# Patient Record
Sex: Male | Born: 1955 | Race: White | Hispanic: No | Marital: Married | Smoking: Never smoker
Health system: Southern US, Community
[De-identification: ages and names within clinical notes are randomized; demographics above are authoritative.]

## PROBLEM LIST (undated history)

## (undated) DIAGNOSIS — J45909 Unspecified asthma, uncomplicated: Secondary | ICD-10-CM

## (undated) DIAGNOSIS — Z85828 Personal history of other malignant neoplasm of skin: Secondary | ICD-10-CM

## (undated) DIAGNOSIS — E785 Hyperlipidemia, unspecified: Secondary | ICD-10-CM

## (undated) DIAGNOSIS — A7749 Other ehrlichiosis: Secondary | ICD-10-CM

## (undated) HISTORY — DX: Hyperlipidemia, unspecified: E78.5

## (undated) HISTORY — PX: BASAL CELL CARCINOMA EXCISION: SHX1214

## (undated) HISTORY — DX: Other ehrlichiosis: A77.49

---

## 2010-11-01 HISTORY — PX: COLONOSCOPY: SHX174

## 2010-12-03 ENCOUNTER — Ambulatory Visit: Payer: Self-pay | Admitting: Internal Medicine

## 2015-05-20 ENCOUNTER — Ambulatory Visit: Payer: Self-pay | Admitting: Internal Medicine

## 2015-05-21 ENCOUNTER — Ambulatory Visit (INDEPENDENT_AMBULATORY_CARE_PROVIDER_SITE_OTHER): Payer: BLUE CROSS/BLUE SHIELD | Admitting: Internal Medicine

## 2015-05-21 ENCOUNTER — Encounter: Payer: Self-pay | Admitting: Internal Medicine

## 2015-05-21 VITALS — BP 144/84 | HR 72 | Ht 72.0 in | Wt 225.0 lb

## 2015-05-21 DIAGNOSIS — D485 Neoplasm of uncertain behavior of skin: Secondary | ICD-10-CM | POA: Diagnosis not present

## 2015-05-21 DIAGNOSIS — L309 Dermatitis, unspecified: Secondary | ICD-10-CM

## 2015-05-21 DIAGNOSIS — D126 Benign neoplasm of colon, unspecified: Secondary | ICD-10-CM | POA: Insufficient documentation

## 2015-05-21 DIAGNOSIS — J45909 Unspecified asthma, uncomplicated: Secondary | ICD-10-CM | POA: Insufficient documentation

## 2015-05-21 DIAGNOSIS — R609 Edema, unspecified: Secondary | ICD-10-CM | POA: Insufficient documentation

## 2015-05-21 DIAGNOSIS — E7849 Other hyperlipidemia: Secondary | ICD-10-CM | POA: Insufficient documentation

## 2015-05-21 MED ORDER — CLOBETASOL PROPIONATE 0.05 % EX LOTN: 1.0000 "application " | TOPICAL_LOTION | Freq: Two times a day (BID) | CUTANEOUS | Status: DC

## 2015-05-21 MED ORDER — PREDNISONE 10 MG (21) PO TBPK: 10.0000 mg | ORAL_TABLET | Freq: Every day | ORAL | Status: DC

## 2015-05-21 MED ORDER — SULFAMETHOXAZOLE-TRIMETHOPRIM 800-160 MG PO TABS: 1.0000 | ORAL_TABLET | Freq: Two times a day (BID) | ORAL | Status: DC

## 2015-05-21 NOTE — Progress Notes (Signed)
Date:  05/21/2015   Name:  John Mathews   DOB:  April 08, 1956   MRN:  [id number]   Chief Complaint: Rash Rash This is a recurrent problem. The current episode started 1 to 4 weeks ago. The problem has been rapidly worsening since onset. The affected locations include the abdomen, right axilla, left axilla, chest and torso. The rash is characterized by blistering, burning, itchiness, draining, scaling and redness. Pertinent negatives include no cough, fatigue or fever. Past treatments include antihistamine and topical steroids.  He has had this rash before - seen by Dr. Nehemiah Mathews in the past.  He also has multiple skin lesions that he thinks are likely skin cancer.  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  He is not aware of any other triggers.  He has been using clobetasol with some benefit but the rash is most persistent this time.   Review of Systems:  Review of Systems  Constitutional: Negative for fever, chills and fatigue.  Respiratory: Negative for cough and wheezing.   Cardiovascular: Negative for chest pain.  Musculoskeletal: Negative for myalgias and arthralgias.  Skin: Positive for rash.    Patient Active Problem List   Diagnosis Date Noted  . 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 AB (asthmatic bronchitis) 05/21/2015  . Accumulation of fluid in tissues 05/21/2015  . History of colon polyps 05/21/2015    Prior to Admission medications   Medication Sig Start Date End Date Taking? Authorizing Provider  budesonide-formoterol (SYMBICORT) 160-4.5 MCG/ACT inhaler Inhale 1 Inhaler into the lungs 2 (two) times daily. 02/10/15  Yes Historical Provider, MD  Clobetasol Propionate (CLOBEX) 0.05 % lotion Clobex, 0.05% (External Lotion) - Historical Medication  (0.05 %) Active   Yes Historical Provider, MD    No Known Allergies  No past surgical history on file.  History  Substance Use Topics  . Smoking  status: Never Smoker   . Smokeless tobacco: Not on file  . Alcohol Use: 0.0 oz/week    0 Standard drinks or equivalent per week     Medication list has been reviewed and updated.  Physical Examination:  Physical Exam  Constitutional: He appears well-developed and well-nourished.  Cardiovascular: Normal rate and regular rhythm.   Pulmonary/Chest: Effort normal and breath sounds normal. He has no wheezes.  Skin: Skin is warm and dry. Rash noted. Rash is papular and pustular. There is erythema.  Multiple papular lesions over chest and axilla - some with erythema and crusting.  No bleeding.  Scaly lesions over both arms and forehead - suspicious for malignancy.    BP 164/80 mmHg  Pulse 72  Ht 6' (1.829 m)  Wt 225 lb (102.059 kg)  BMI 30.51 kg/m2  Assessment and Plan: 1. Dermatitis of multiple sites With bacterial super-infection - sulfamethoxazole-trimethoprim (BACTRIM DS,SEPTRA DS) 800-160 MG per tablet; Take 1 tablet by mouth 2 (two) times daily.  Dispense: 20 tablet; Refill: 0 - predniSONE (STERAPRED UNI-PAK 21 TAB) 10 MG (21) TBPK tablet; Take 1 tablet (10 mg total) by mouth daily.  Dispense: 21 tablet; Refill: 0 - Clobetasol Propionate (CLOBEX) 0.05 % lotion; Apply 1 application topically 2 (two) times daily.  Dispense: 59 mL; Refill: 1 - Ambulatory referral to Dermatology  2. Neoplasm of uncertain behavior of skin Needs Dermatology evaluation   Halina Maidens, MD [facility]  05/21/2015

## 2015-05-30 ENCOUNTER — Ambulatory Visit (INDEPENDENT_AMBULATORY_CARE_PROVIDER_SITE_OTHER): Payer: BLUE CROSS/BLUE SHIELD | Admitting: Internal Medicine

## 2015-05-30 ENCOUNTER — Encounter: Payer: Self-pay | Admitting: Internal Medicine

## 2015-05-30 VITALS — BP 104/64 | HR 80 | Temp 98.2°F | Ht 72.0 in | Wt 211.8 lb

## 2015-05-30 DIAGNOSIS — J0100 Acute maxillary sinusitis, unspecified: Secondary | ICD-10-CM

## 2015-05-30 MED ORDER — AMOXICILLIN 875 MG PO TABS: 875.0000 mg | ORAL_TABLET | Freq: Two times a day (BID) | ORAL | Status: DC

## 2015-05-30 MED ORDER — FLUTICASONE PROPIONATE 50 MCG/ACT NA SUSP: 2.0000 | Freq: Every day | NASAL | Status: DC

## 2015-05-30 NOTE — Progress Notes (Signed)
Date:  05/30/2015   Name:  John Mathews   DOB:  1956-08-08   MRN:  [id number]   Chief Complaint: Cough; Rash; Sinusitis; and Fever Rash This is a chronic problem. The current episode started 1 to 4 weeks ago. The problem is unchanged. The affected locations include the left axilla and right axilla (dermatology thinks the rash is sensitivity). Associated symptoms include a fever and a sore throat. Pertinent negatives include no cough or shortness of breath. Treatments tried: Dermatology removed a lesion on his hand and froze several lesions on his back.  Sinusitis This is a new problem. The current episode started in the past 7 days. The problem is unchanged. Maximum temperature: felt feverish but did not measure. Associated symptoms include chills, headaches, sinus pressure and a sore throat. Pertinent negatives include no coughing, diaphoresis, ear pain or shortness of breath. Past treatments include nothing.  Fever  This is a new problem. The current episode started yesterday. His temperature was unmeasured prior to arrival. Associated symptoms include headaches, a rash and a sore throat. Pertinent negatives include no abdominal pain, chest pain, coughing, ear pain or wheezing. He has tried NSAIDs for the symptoms. The treatment provided moderate relief.     Review of Systems:  Review of Systems  Constitutional: Positive for fever and chills. Negative for diaphoresis.  HENT: Positive for sinus pressure and sore throat. Negative for ear discharge and ear pain.   Respiratory: Negative for cough, chest tightness, shortness of breath and wheezing.   Cardiovascular: Negative for chest pain and palpitations.  Gastrointestinal: Negative for abdominal pain.  Skin: Positive for rash.  Neurological: Positive for dizziness and headaches. Negative for syncope.    Patient Active Problem List   Diagnosis Date Noted  . 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 AB  (asthmatic bronchitis) 05/21/2015  . Accumulation of fluid in tissues 05/21/2015  . History of colon polyps 05/21/2015  . Dermatitis of multiple sites 05/21/2015    Prior to Admission medications   Medication Sig Start Date End Date Taking? Authorizing Provider  budesonide-formoterol (SYMBICORT) 160-4.5 MCG/ACT inhaler Inhale 1 Inhaler into the lungs 2 (two) times daily. 02/10/15  Yes Historical Provider, MD  Clobetasol Propionate (CLOBEX) 0.05 % lotion Apply 1 application topically 2 (two) times daily. 05/21/15  Yes Glean Hess, MD    No Known Allergies  No past surgical history on file.  History  Substance Use Topics  . Smoking status: Never Smoker   . Smokeless tobacco: Not on file  . Alcohol Use: 0.0 oz/week    0 Standard drinks or equivalent per week     Medication list has been reviewed and updated.  Physical Examination:  Physical Exam  Constitutional: He is oriented to person, place, and time. He appears well-developed. No distress.  HENT:  Head: Normocephalic and atraumatic.  Right Ear: Ear canal normal. Tympanic membrane is erythematous and retracted.  Left Ear: Ear canal normal. Tympanic membrane is erythematous and retracted.  Nose: Right sinus exhibits maxillary sinus tenderness. Right sinus exhibits no frontal sinus tenderness. Left sinus exhibits maxillary sinus tenderness. Left sinus exhibits no frontal sinus tenderness.  Mouth/Throat: Uvula is midline. Oropharyngeal exudate and posterior oropharyngeal erythema present. No posterior oropharyngeal edema.  Eyes: Conjunctivae are normal. Right eye exhibits no discharge. Left eye exhibits no discharge. No scleral icterus.  Neck: Normal range of motion. Neck supple.  Cardiovascular: Normal rate, regular rhythm and normal heart sounds.   Pulmonary/Chest: Effort normal and breath sounds normal.  No respiratory distress.  Musculoskeletal: Normal range of motion.  Lymphadenopathy:    He has no cervical adenopathy.   Neurological: He is alert and oriented to person, place, and time.  Skin: Skin is warm and dry. No rash noted.  Skin: Skin is warm and dry. Rash noted. Rash is papular and pustular. There is erythema.  Multiple papular lesions over chest and axilla - some with erythema and crusting. No bleeding. Rash under axilla less inflammed than last visit   Psychiatric: He has a normal mood and affect. His behavior is normal. Thought content normal.    BP 104/64 mmHg  Pulse 80  Temp(Src) 98.2 F (36.8 C)  Ht 6' (1.829 m)  Wt 211 lb 12.8 oz (96.072 kg)  BMI 28.72 kg/m2  SpO2 98%  Assessment and Plan: 1. Acute maxillary sinusitis, recurrence not specified - fluticasone (FLONASE) 50 MCG/ACT nasal spray; Place 2 sprays into both nostrils daily.  Dispense: 16 g; Refill: 6 - amoxicillin (AMOXIL) 875 MG tablet; Take 1 tablet (875 mg total) by mouth 2 (two) times daily.  Dispense: 20 tablet; Refill: 0   Halina Maidens, MD [facility]  05/30/2015

## 2015-06-03 ENCOUNTER — Inpatient Hospital Stay: Payer: BLUE CROSS/BLUE SHIELD

## 2015-06-03 ENCOUNTER — Emergency Department: Payer: BLUE CROSS/BLUE SHIELD

## 2015-06-03 ENCOUNTER — Ambulatory Visit (INDEPENDENT_AMBULATORY_CARE_PROVIDER_SITE_OTHER)
Admission: EM | Admit: 2015-06-03 | Discharge: 2015-06-03 | Disposition: A | Discharge status: Home / Self Care | Payer: BLUE CROSS/BLUE SHIELD | Source: Home / Self Care | Attending: Emergency Medicine | Admitting: Emergency Medicine

## 2015-06-03 ENCOUNTER — Inpatient Hospital Stay
Admission: EM | Admit: 2015-06-03 | Discharge: 2015-06-06 | DRG: 872 | Disposition: A | Discharge status: Home / Self Care | Payer: BLUE CROSS/BLUE SHIELD | Attending: Internal Medicine | Admitting: Internal Medicine

## 2015-06-03 DIAGNOSIS — E860 Dehydration: Secondary | ICD-10-CM | POA: Diagnosis not present

## 2015-06-03 DIAGNOSIS — D696 Thrombocytopenia, unspecified: Secondary | ICD-10-CM | POA: Diagnosis present

## 2015-06-03 DIAGNOSIS — R21 Rash and other nonspecific skin eruption: Secondary | ICD-10-CM | POA: Diagnosis present

## 2015-06-03 DIAGNOSIS — R509 Fever, unspecified: Secondary | ICD-10-CM | POA: Insufficient documentation

## 2015-06-03 DIAGNOSIS — A94 Unspecified arthropod-borne viral fever: Secondary | ICD-10-CM | POA: Diagnosis present

## 2015-06-03 DIAGNOSIS — Z79899 Other long term (current) drug therapy: Secondary | ICD-10-CM

## 2015-06-03 DIAGNOSIS — Z85828 Personal history of other malignant neoplasm of skin: Secondary | ICD-10-CM

## 2015-06-03 DIAGNOSIS — A419 Sepsis, unspecified organism: Secondary | ICD-10-CM

## 2015-06-03 DIAGNOSIS — I959 Hypotension, unspecified: Secondary | ICD-10-CM | POA: Diagnosis present

## 2015-06-03 DIAGNOSIS — E871 Hypo-osmolality and hyponatremia: Secondary | ICD-10-CM | POA: Diagnosis not present

## 2015-06-03 DIAGNOSIS — J45909 Unspecified asthma, uncomplicated: Secondary | ICD-10-CM

## 2015-06-03 DIAGNOSIS — I340 Nonrheumatic mitral (valve) insufficiency: Secondary | ICD-10-CM | POA: Diagnosis not present

## 2015-06-03 HISTORY — DX: Unspecified asthma, uncomplicated: J45.909

## 2015-06-03 HISTORY — DX: Personal history of other malignant neoplasm of skin: Z85.828

## 2015-06-03 LAB — COMPREHENSIVE METABOLIC PANEL
ALT: 161 U/L — ABNORMAL HIGH (ref 17–63)
ALT: 146 U/L — ABNORMAL HIGH (ref 17–63)
AST: 105 U/L — ABNORMAL HIGH (ref 15–41)
AST: 93 U/L — ABNORMAL HIGH (ref 15–41)
Albumin: 2.9 g/dL — ABNORMAL LOW (ref 3.5–5.0)
Albumin: 2.5 g/dL — ABNORMAL LOW (ref 3.5–5.0)
Alkaline Phosphatase: 106 U/L (ref 38–126)
Alkaline Phosphatase: 88 U/L (ref 38–126)
Anion gap: 10 (ref 5–15)
Anion gap: 10 (ref 5–15)
BILIRUBIN TOTAL: 1.9 mg/dL — AB (ref 0.3–1.2)
BUN: 20 mg/dL (ref 6–20)
BUN: 18 mg/dL (ref 6–20)
CALCIUM: 7.6 mg/dL — AB (ref 8.9–10.3)
CO2: 25 mmol/L (ref 22–32)
CO2: 24 mmol/L (ref 22–32)
Calcium: 8.1 mg/dL — ABNORMAL LOW (ref 8.9–10.3)
Chloride: 89 mmol/L — ABNORMAL LOW (ref 101–111)
Chloride: 94 mmol/L — ABNORMAL LOW (ref 101–111)
Creatinine, Ser: 0.9 mg/dL (ref 0.61–1.24)
Creatinine, Ser: 0.94 mg/dL (ref 0.61–1.24)
GFR calc Af Amer: >60 mL/min (ref >60)
GFR calc Af Amer: >60 mL/min (ref >60)
GFR calc non Af Amer: >60 mL/min (ref >60)
GFR calc non Af Amer: >60 mL/min (ref >60)
Glucose, Bld: 135 mg/dL — ABNORMAL HIGH (ref 65–99)
Glucose, Bld: 116 mg/dL — ABNORMAL HIGH (ref 65–99)
POTASSIUM: 3.5 mmol/L (ref 3.5–5.1)
Potassium: 3.6 mmol/L (ref 3.5–5.1)
Sodium: 124 mmol/L — ABNORMAL LOW (ref 135–145)
Sodium: 128 mmol/L — ABNORMAL LOW (ref 135–145)
Total Bilirubin: 2.2 mg/dL — ABNORMAL HIGH (ref 0.3–1.2)
Total Protein: 6.1 g/dL — ABNORMAL LOW (ref 6.5–8.1)
Total Protein: 5.5 g/dL — ABNORMAL LOW (ref 6.5–8.1)

## 2015-06-03 LAB — URINALYSIS COMPLETE WITH MICROSCOPIC (ARMC ONLY)
Glucose, UA: NEGATIVE mg/dL
Leukocytes, UA: NEGATIVE
Nitrite: NEGATIVE
Protein, ur: 100 mg/dL — AB
Specific Gravity, Urine: 1.02 (ref 1.005–1.030)
pH: 5.5 (ref 5.0–8.0)

## 2015-06-03 LAB — CBC WITH DIFFERENTIAL/PLATELET
Basophils Absolute: 0 10*3/uL (ref 0–0.1)
Basophils Absolute: 0 10*3/uL (ref 0–0.1)
Basophils Relative: 1 %
Basophils Relative: 0 %
EOS ABS: 0 10*3/uL (ref 0–0.7)
Eosinophils Absolute: 0 10*3/uL (ref 0–0.7)
Eosinophils Relative: 0 %
Eosinophils Relative: 0 %
HCT: 37.2 % — ABNORMAL LOW (ref 40.0–52.0)
HCT: 36 % — ABNORMAL LOW (ref 40.0–52.0)
Hemoglobin: 13.2 g/dL (ref 13.0–18.0)
Hemoglobin: 12.5 g/dL — ABNORMAL LOW (ref 13.0–18.0)
LYMPHS ABS: 0.4 10*3/uL — AB (ref 1.0–3.6)
Lymphocytes Relative: 4 %
Lymphs Abs: 0.2 10*3/uL — ABNORMAL LOW (ref 1.0–3.6)
MCH: 33.2 pg (ref 26.0–34.0)
MCH: 33.1 pg (ref 26.0–34.0)
MCHC: 35.6 g/dL (ref 32.0–36.0)
MCHC: 34.9 g/dL (ref 32.0–36.0)
MCV: 93.4 fL (ref 80.0–100.0)
MCV: 94.9 fL (ref 80.0–100.0)
Monocytes Absolute: 0.3 10*3/uL (ref 0.2–1.0)
Monocytes Absolute: 0.2 10*3/uL (ref 0.2–1.0)
Monocytes Relative: 6 %
Monocytes Relative: 4 %
Neutro Abs: 4.4 10*3/uL (ref 1.4–6.5)
Neutro Abs: 4 10*3/uL (ref 1.4–6.5)
Neutrophils Relative %: 89 %
PLATELETS: 50 10*3/uL — AB (ref 150–440)
Platelets: 47 10*3/uL — ABNORMAL LOW (ref 150–440)
RBC: 3.98 MIL/uL — ABNORMAL LOW (ref 4.40–5.90)
RBC: 3.79 MIL/uL — AB (ref 4.40–5.90)
RDW: 13.4 % (ref 11.5–14.5)
RDW: 13.3 % (ref 11.5–14.5)
WBC: 5 10*3/uL (ref 3.8–10.6)
WBC: 4.6 10*3/uL (ref 3.8–10.6)

## 2015-06-03 LAB — RAPID HIV SCREEN (HIV 1/2 AB+AG)
HIV 1/2 Antibodies: NONREACTIVE
HIV-1 P24 ANTIGEN - HIV24: NONREACTIVE

## 2015-06-03 LAB — LACTIC ACID, PLASMA
Lactic Acid, Venous: 1.9 mmol/L (ref 0.5–2.0)
Lactic Acid, Venous: 1.5 mmol/L (ref 0.5–2.0)

## 2015-06-03 LAB — PROTIME-INR
INR: 1.13
Prothrombin Time: 14.7 seconds (ref 11.4–15.0)

## 2015-06-03 LAB — APTT: aPTT: 35 seconds (ref 24–36)

## 2015-06-03 LAB — PROCALCITONIN: PROCALCITONIN: 1.97 ng/mL

## 2015-06-03 LAB — SEDIMENTATION RATE: Sed Rate: 32 mm/hr — ABNORMAL HIGH (ref 0–16)

## 2015-06-03 MED ORDER — VANCOMYCIN HCL IN DEXTROSE 1-5 GM/200ML-% IV SOLN
1000.0000 mg | Freq: Once | INTRAVENOUS | Status: AC
  Administered 2015-06-03: 1000 mg via INTRAVENOUS
  Filled 2015-06-03: qty 200

## 2015-06-03 MED ORDER — IBUPROFEN 800 MG PO TABS
800.0000 mg | ORAL_TABLET | Freq: Once | ORAL | Status: AC
  Administered 2015-06-03: 800 mg via ORAL

## 2015-06-03 MED ORDER — SODIUM CHLORIDE 0.9 % IV BOLUS (SEPSIS): 1062.0000 mL | Freq: Once | INTRAVENOUS | Status: AC

## 2015-06-03 MED ORDER — PIPERACILLIN-TAZOBACTAM 3.375 G IVPB: 3.3750 g | Freq: Once | INTRAVENOUS | Status: DC

## 2015-06-03 MED ORDER — NOREPINEPHRINE BITARTRATE 1 MG/ML IV SOLN: 5.0000 ug/min | INTRAVENOUS | Status: DC

## 2015-06-03 MED ORDER — SODIUM CHLORIDE 0.9 % IV SOLN: INTRAVENOUS | Status: DC

## 2015-06-03 MED ORDER — SODIUM CHLORIDE 0.9 % IV BOLUS (SEPSIS): 500.0000 mL | INTRAVENOUS | Status: AC

## 2015-06-03 MED ORDER — ALUM & MAG HYDROXIDE-SIMETH 200-200-20 MG/5ML PO SUSP: 30.0000 mL | Freq: Four times a day (QID) | ORAL | Status: DC | PRN

## 2015-06-03 MED ORDER — DOCUSATE SODIUM 100 MG PO CAPS
100.0000 mg | ORAL_CAPSULE | Freq: Two times a day (BID) | ORAL | Status: DC
  Administered 2015-06-03 – 2015-06-05 (×4): 100 mg via ORAL
  Filled 2015-06-03 (×5): qty 1

## 2015-06-03 MED ORDER — PIPERACILLIN-TAZOBACTAM 3.375 G IVPB 30 MIN: 3.3750 g | Freq: Once | INTRAVENOUS | Status: DC

## 2015-06-03 MED ORDER — VANCOMYCIN HCL IN DEXTROSE 1-5 GM/200ML-% IV SOLN
1000.0000 mg | Freq: Three times a day (TID) | INTRAVENOUS | Status: DC
  Administered 2015-06-04 – 2015-06-05 (×4): 1000 mg via INTRAVENOUS
  Filled 2015-06-03 (×5): qty 200

## 2015-06-03 MED ORDER — BUDESONIDE-FORMOTEROL FUMARATE 160-4.5 MCG/ACT IN AERO
2.0000 | INHALATION_SPRAY | Freq: Two times a day (BID) | RESPIRATORY_TRACT | Status: DC
  Administered 2015-06-04 – 2015-06-06 (×5): 2 via RESPIRATORY_TRACT
  Filled 2015-06-03: qty 6

## 2015-06-03 MED ORDER — PIPERACILLIN-TAZOBACTAM 3.375 G IVPB 30 MIN
3.3750 g | Freq: Once | INTRAVENOUS | Status: AC
  Administered 2015-06-03: 3.375 g via INTRAVENOUS
  Filled 2015-06-03: qty 50

## 2015-06-03 MED ORDER — SODIUM CHLORIDE 0.9 % IJ SOLN
3.0000 mL | Freq: Two times a day (BID) | INTRAMUSCULAR | Status: DC
  Administered 2015-06-03 – 2015-06-05 (×4): 3 mL via INTRAVENOUS

## 2015-06-03 MED ORDER — ACETAMINOPHEN 650 MG RE SUPP: 650.0000 mg | Freq: Four times a day (QID) | RECTAL | Status: DC | PRN

## 2015-06-03 MED ORDER — SODIUM CHLORIDE 0.9 % IV BOLUS (SEPSIS)
1000.0000 mL | Freq: Once | INTRAVENOUS | Status: AC
  Administered 2015-06-03: 1000 mL via INTRAVENOUS

## 2015-06-03 MED ORDER — ACETAMINOPHEN 325 MG PO TABS
650.0000 mg | ORAL_TABLET | Freq: Four times a day (QID) | ORAL | Status: DC | PRN
  Administered 2015-06-03: 650 mg via ORAL
  Filled 2015-06-03: qty 2

## 2015-06-03 MED ORDER — ONDANSETRON HCL 4 MG/2ML IJ SOLN
4.0000 mg | Freq: Four times a day (QID) | INTRAMUSCULAR | Status: DC | PRN
  Administered 2015-06-05: 4 mg via INTRAVENOUS
  Filled 2015-06-03: qty 2

## 2015-06-03 MED ORDER — SODIUM CHLORIDE 0.9 % IV BOLUS (SEPSIS): 1000.0000 mL | INTRAVENOUS | Status: AC

## 2015-06-03 MED ORDER — BISACODYL 5 MG PO TBEC: 5.0000 mg | DELAYED_RELEASE_TABLET | Freq: Every day | ORAL | Status: DC | PRN

## 2015-06-03 MED ORDER — ONDANSETRON HCL 4 MG PO TABS
4.0000 mg | ORAL_TABLET | Freq: Four times a day (QID) | ORAL | Status: DC | PRN
Start: 2015-06-03 — End: 2015-06-06

## 2015-06-03 MED ORDER — DOXYCYCLINE HYCLATE 100 MG PO TABS
100.0000 mg | ORAL_TABLET | Freq: Two times a day (BID) | ORAL | Status: DC
Start: 2015-06-03 — End: 2015-06-06
  Administered 2015-06-03 – 2015-06-06 (×6): 100 mg via ORAL
  Filled 2015-06-03 (×6): qty 1

## 2015-06-03 MED ORDER — HYDROCODONE-ACETAMINOPHEN 5-325 MG PO TABS
1.0000 | ORAL_TABLET | ORAL | Status: DC | PRN
  Administered 2015-06-04 (×2): 2 via ORAL
  Administered 2015-06-04: 1 via ORAL
  Administered 2015-06-04: 2 via ORAL
  Filled 2015-06-03: qty 1
  Filled 2015-06-03 (×3): qty 2

## 2015-06-03 MED ORDER — SODIUM CHLORIDE 0.9 % IV SOLN
INTRAVENOUS | Status: DC
  Administered 2015-06-03 – 2015-06-05 (×3): via INTRAVENOUS

## 2015-06-03 MED ORDER — VANCOMYCIN HCL IN DEXTROSE 1-5 GM/200ML-% IV SOLN: 1000.0000 mg | Freq: Once | INTRAVENOUS | Status: DC

## 2015-06-03 MED ORDER — FLUTICASONE PROPIONATE 50 MCG/ACT NA SUSP
2.0000 | Freq: Every day | NASAL | Status: DC
  Administered 2015-06-04 – 2015-06-06 (×3): 2 via NASAL
  Filled 2015-06-03: qty 16

## 2015-06-03 MED ORDER — CLOBETASOL PROPIONATE 0.05 % EX CREA
1.0000 "application " | TOPICAL_CREAM | Freq: Two times a day (BID) | CUTANEOUS | Status: DC
  Administered 2015-06-04: 1 via TOPICAL
  Filled 2015-06-03: qty 15

## 2015-06-03 MED ORDER — PIPERACILLIN-TAZOBACTAM 3.375 G IVPB
3.3750 g | Freq: Three times a day (TID) | INTRAVENOUS | Status: DC
  Administered 2015-06-04 – 2015-06-05 (×5): 3.375 g via INTRAVENOUS
  Filled 2015-06-03 (×6): qty 50

## 2015-06-03 NOTE — ED Provider Notes (Signed)
[facility] Emergency Department Provider Note  ____________________________________________  Time seen: Approximately 5:33 PM  I have reviewed the triage vital signs and the nursing notes.   HISTORY  Chief Complaint Fever; Nausea; and Emesis    HPI John Mathews is a 59 y.o. male with history of asthma who presents for evaluation of approximately one week gradual onset fever, chills, constant since onset. He is also had 5 days constant headache, gradual onset, not associated with any neck pain or stiffness, photophobia or photophobia. He has had one week of rash as well. He was seen by dermatologist with a biopsy however he has not received results. 5 days ago he was started on amoxicillin for possible sinusitis however he reports his symptoms have not improved. He was seen in urgent care earlier today and was sent to the emergency department for fever of unknown origin as well as hyponatremia. Current severity of symptoms is moderate to severe. No modifying factors. He does work outdoors and is unsure whether or not he has had a tick exposure however he has not removed a tick from his body recently.   Past Medical History  Diagnosis Date  . Asthma   . History of basal cell cancer     Patient Active Problem List   Diagnosis Date Noted  . 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 AB (asthmatic bronchitis) 05/21/2015  . Accumulation of fluid in tissues 05/21/2015  . History of colon polyps 05/21/2015  . Dermatitis of multiple sites 05/21/2015    Past Surgical History  Procedure Laterality Date  . Basal cell carcinoma excision      Current Outpatient Rx  Name  Route  Sig  Dispense  Refill  . amoxicillin (AMOXIL) 875 MG tablet   Oral   Take 1 tablet (875 mg total) by mouth 2 (two) times daily.   20 tablet   0   . budesonide-formoterol (SYMBICORT) 160-4.5 MCG/ACT inhaler   Inhalation   Inhale 1 Inhaler into the lungs 2  (two) times daily.         . Clobetasol Propionate (CLOBEX) 0.05 % lotion   Topical   Apply 1 application topically 2 (two) times daily.   59 mL   1   . fluticasone (FLONASE) 50 MCG/ACT nasal spray   Each Nare   Place 2 sprays into both nostrils daily.   16 g   6     Allergies Review of patient's allergies indicates no known allergies.  Family History  Problem Relation Age of Onset  . Schizophrenia Mother   . Dementia Father     Social History History  Substance Use Topics  . Smoking status: Never Smoker   . Smokeless tobacco: Never Used  . Alcohol Use: 0.0 oz/week    0 Standard drinks or equivalent per week     Comment: occasional    Review of Systems Constitutional: + fever/chills Eyes: No visual changes. ENT: No sore throat. Cardiovascular: Denies chest pain. Respiratory: Denies shortness of breath. Gastrointestinal: No abdominal pain.  + nausea, no vomiting.  No diarrhea.  No constipation. Genitourinary: Negative for dysuria. Musculoskeletal: Negative for back pain. Skin: Positive for rash. Neurological: Posiive for headache, no focal weakness or numbness.  10-point ROS otherwise negative.  ____________________________________________   PHYSICAL EXAM:  VITAL SIGNS: ED Triage Vitals  Enc Vitals Group     BP 06/03/15 1409 107/55 mmHg     Pulse Rate 06/03/15 1409 107     Resp 06/03/15  1409 18     Temp 06/03/15 1409 99.6 F (37.6 C)     Temp Source 06/03/15 1409 Oral     SpO2 06/03/15 1409 99 %     Weight 06/03/15 1409 225 lb (102.059 kg)     Height 06/03/15 1409 6' (1.829 m)     Head Cir --      Peak Flow --      Pain Score 06/03/15 1410 7     Pain Loc --      Pain Edu? --      Excl. in [place]? --     Constitutional: Alert and oriented. Nontoxic appearing and in no acute distress. Eyes: Conjunctivae are normal. PERRL. EOMI. Head: Atraumatic. Nose: No congestion/rhinnorhea. Mouth/Throat: Mucous membranes are moist.  Oropharynx  non-erythematous. Neck: No stridor.   Cardiovascular: tachycardic rate, regular rhythm. Grossly normal heart sounds.  Good peripheral circulation. Respiratory: Normal respiratory effort.  No retractions. Lungs CTAB. Gastrointestinal: Soft and nontender. No distention. No abdominal bruits. No CVA tenderness. Genitourinary: deferred Musculoskeletal: No lower extremity tenderness nor edema.  No joint effusions. Healing biopsy site to the chest and right hand. Neurologic:  Normal speech and language. No gross focal neurologic deficits are appreciated. No gait instability. Skin:  Skin is warm, dry and intact. Blanching petechial rash throughout the chest. Psychiatric: Mood and affect are normal. Speech and behavior are normal.  ____________________________________________   LABS (all labs ordered are listed, but only abnormal results are displayed)  Labs Reviewed  CULTURE, BLOOD (ROUTINE X 2)  CULTURE, BLOOD (ROUTINE X 2)  LACTIC ACID, PLASMA  LACTIC ACID, PLASMA   ____________________________________________  EKG  none ____________________________________________  RADIOLOGY  CXR IMPRESSION: Findings suggest bronchitis, acute versus chronic.  Streaky bibasilar atelectasis but no focal airspace consolidation or pleural effusion.  ____________________________________________   PROCEDURES  Procedure(s) performed: None  Critical Care performed: Yes, see critical care note(s). Total critical care time spent 30 minutes.  ____________________________________________   INITIAL IMPRESSION / ASSESSMENT AND PLAN / ED COURSE  Pertinent labs & imaging results that were available during my care of the patient were reviewed by me and considered in my medical decision making (see chart for details).  John Mathews is a 59 y.o. male with history of asthma who presents for evaluation of approximately one week gradual onset fever, chills, constant since onset. On exam, he is  nontoxic appearing but he does appear mildly fatigued. He is meeting 2 out of 4 Sirs criteria for fever, tachycardia. Neck is supple without meningismus, doubt meningitis and given 1 weeks of symptoms and nontoxic appearance, I feel meningitis is very unlikely. He does have a blanching petechial rash throughout the torso. Clinical picture most consistent with sepsis possibly secondary to tick borne illness. Labs from urgent care reviewed and are notable for hyponatremia with a sodium of 124. Labs were also notable for mild ESR elevation at 34. No leukocytosis, no anemia. Normal creatinine. Urinalysis not consistent with infection. Plan for chest x-ray to rule out pneumonia. We'll give IV fluids, vancomycin and Zosyn. Anticipate admission. Of note, titers for RMSF, [place]rellia sent at urgent care.  ----------------------------------------- 6:25 PM on 06/03/2015 -----------------------------------------  Tachycardia improving. Chest x-ray shows no evidence of pneumonia. Case discussed with hospitalist for admission. ____________________________________________   FINAL CLINICAL IMPRESSION(S) / ED DIAGNOSES  Final diagnoses:  Sepsis, due to unspecified organism  Hyponatremia      Joanne Gavel, MD 06/03/15 [phone]

## 2015-06-03 NOTE — ED Notes (Signed)
Patient states that fevers started Wednesday. Patient states that he had been on Bactrim antibiotics when the fever started. He states that Friday he went back to Dr. Carolin Coy and she took him off of bactrim and placed him on amoxicillin which he is currently taking. He states that Dr. Carolin Coy thought that he had a sinus infection and ear ache. He states that the headaches and fever have not broke. He states that he has been having dry heaves and is unable to eat. States that he has been having chills and night sweats. States that he is works outside and hasn't been bit by ticks recently.

## 2015-06-03 NOTE — H&P (Signed)
John Mathews at [facility] NAME: John Mathews    MR#:  [id number]  DATE OF BIRTH:  08/12/1956  DATE OF ADMISSION:  06/03/2015  PRIMARY CARE PHYSICIAN: John Maidens, MD   REQUESTING/REFERRING PHYSICIAN: Darrick Mathews, M.D.  CHIEF COMPLAINT:   Chief Complaint  Patient presents with  . Fever  . Nausea  . Emesis   HISTORY OF PRESENT ILLNESS:  John Mathews  is a 59 y.o. male with a known history of asthma, basal cell carcinoma diagnosed in 2007 is being admitted for sepsis.  He reports gradual onset fever for last 6 days , associated with chills, night sweats, constant since onset. He is also had 5 days constant headache, gradual onset, not associated with any neck pain or stiffness, photophobia or phonophobia. He has had one week of rash as well. He was seen by dermatologist who performed biopsy last Tuesday however he has not received results. 5 days ago he was started on amoxicillin for possible sinusitis however he reports his symptoms have not improved. He was seen in [place] urgent care earlier today and was sent to the emergency department for fever of unknown origin as well as hyponatremia. He does work outdoors and is unsure whether or not he has had a tick exposure however he has not removed a tick from his body recently and states that he uses permethrin for last 3 months which makes it very unlikely to have Tick exposure.  He also reports having some blood in the urine while at urgent care. PAST MEDICAL HISTORY:   Past Medical History  Diagnosis Date  . Asthma   . History of basal cell cancer    PAST SURGICAL HISTORY:   Past Surgical History  Procedure Laterality Date  . Basal cell carcinoma excision     SOCIAL HISTORY:   History  Substance Use Topics  . Smoking status: Never Smoker   . Smokeless tobacco: Never Used  . Alcohol Use: 0.0 oz/week    0 Standard drinks or equivalent per week     Comment: occasional   FAMILY  HISTORY:   Family History  Problem Relation Age of Onset  . Schizophrenia Mother   . Dementia Father    DRUG ALLERGIES:  No Known Allergies REVIEW OF SYSTEMS:   Review of Systems  Constitutional: Positive for fever, chills and malaise/fatigue. Negative for weight loss and diaphoresis.  HENT: Negative for ear discharge, ear pain, hearing loss, nosebleeds, sore throat and tinnitus.   Eyes: Negative for blurred vision and pain.  Respiratory: Negative for cough, hemoptysis, shortness of breath and wheezing.   Cardiovascular: Negative for chest pain, palpitations, orthopnea and leg swelling.  Gastrointestinal: Positive for nausea. Negative for heartburn, vomiting, abdominal pain, diarrhea, constipation and blood in stool.  Genitourinary: Positive for hematuria. Negative for dysuria, urgency and frequency.  Musculoskeletal: Positive for myalgias and joint pain. Negative for back pain.  Skin: Positive for rash. Negative for itching.  Neurological: Positive for weakness and headaches. Negative for dizziness, tingling, tremors, focal weakness and seizures.  Psychiatric/Behavioral: Negative for depression. The patient is not nervous/anxious.    MEDICATIONS AT HOME:   Prior to Admission medications   Medication Sig Start Date End Date Taking? Authorizing Provider  amoxicillin (AMOXIL) 875 MG tablet Take 1 tablet (875 mg total) by mouth 2 (two) times daily. 05/30/15  Yes Glean Hess, MD  budesonide-formoterol [facility]) 160-4.5 MCG/ACT inhaler Inhale 1 puff into the lungs 2 (two) times daily.  Yes Historical Provider, MD  Clobetasol Propionate (CLOBEX) 0.05 % lotion Apply 1 application topically 2 (two) times daily. 05/21/15  Yes Glean Hess, MD  fluticasone (FLONASE) 50 MCG/ACT nasal spray Place 2 sprays into both nostrils daily. 05/30/15  Yes Glean Hess, MD   VITAL SIGNS:  Blood pressure 120/75, pulse 99, temperature 99.6 F (37.6 C), temperature source Oral, resp. rate 23,  height 6' (1.829 m), weight 102.059 kg (225 lb), SpO2 97 %. PHYSICAL EXAMINATION:  Physical Exam  Constitutional: He is oriented to person, place, and time and well-developed, well-nourished, and in no distress.  HENT:  Head: Normocephalic and atraumatic.  Eyes: Conjunctivae and EOM are normal. Pupils are equal, round, and reactive to light.  Neck: Normal range of motion. Neck supple. No tracheal deviation present. No thyromegaly present.  Cardiovascular: Normal rate, regular rhythm and normal heart sounds.   Pulmonary/Chest: Effort normal and breath sounds normal. No respiratory distress. He has no wheezes. He exhibits no tenderness.  Abdominal: Soft. Bowel sounds are normal. He exhibits no distension. There is no tenderness.  Musculoskeletal: Normal range of motion.  Neurological: He is alert and oriented to person, place, and time. No cranial nerve deficit.  Skin: Skin is warm and dry. Petechiae and rash noted. Rash is maculopapular.     Psychiatric: Mood and affect normal.   LABORATORY PANEL:   CBC  Recent Labs Lab 06/03/15 1232  WBC 5.0  HGB 13.2  HCT 37.2*  PLT 47*   ------------------------------------------------------------------------------------------------------------------  Chemistries   Recent Labs Lab 06/03/15 1232  NA 124*  K 3.6  CL 89*  CO2 25  GLUCOSE 135*  BUN 20  CREATININE 0.90  CALCIUM 8.1*  AST 105*  ALT 161*  ALKPHOS 106  BILITOT 2.2*   RADIOLOGY:  Dg Chest 2 View  06/03/2015   CLINICAL DATA:  Fever and headaches for 5 days.  EXAM: CHEST  2 VIEW  COMPARISON:  None.  FINDINGS: The cardiac silhouette, mediastinal and hilar contours are within normal limits. Prominent interstitial markings could be due to smoking or acute or chronic bronchitis. No focal airspace consolidation to suggest pneumonia. Streaky bibasilar atelectasis. No pleural effusion. The bony thorax is intact.  IMPRESSION: Findings suggest bronchitis, acute versus chronic.   Streaky bibasilar atelectasis but no focal airspace consolidation or pleural effusion.   Electronically Signed   By: Marijo Sanes M.D.   On: 06/03/2015 18:17   IMPRESSION AND PLAN:   * Sepsis: With fever, hypotension, tachycardia.  No other source, likely tickborne illness - possible [facility] spotted fever, broad-spectrum IV antibiotics, oral doxycycline, blood cultures  * likely tickborne illness - possible [facility] spotted fever.  Fever, petechial rash, severe hyponatremia - thrombocytopenia, elevated LFTs, headache and myalgia.  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 We will treat him with oral doxycycline, consult infectious disease, serology titers ordered for RMSF and Ehrlichia  * Severe hyponatremia: Likely due to likely tickborne illness - possible [facility] spotted fever.  Aggressive IV hydration and monitor  * Severe thrombocytopenia: Petechial rash, avoid antiplatelets. Monitor  * Elevated LFTs: likely tickborne illness - possible [facility] spotted fever, monitor with IV hydration.   All the records are reviewed and case discussed with ED provider. Management plans discussed with the patient and he is in agreement.  CODE STATUS: Full code  TOTAL TIME TAKING CARE OF THIS PATIENT: 55 minutes.    [facility], Kimbella Heisler M.D on 06/03/2015 at 7:35 PM  Between 7am to 6pm - Pager - [phone]  After 6pm go to [email or web address] - password [email or web address] [place]  Office  [phone]  CC: Primary care physician; John Maidens, MD

## 2015-06-03 NOTE — ED Notes (Signed)
Provide pt urinal, no other needs identified at this time

## 2015-06-03 NOTE — ED Notes (Signed)
2 wks ago went to md for rash had biopsy of some of the ras, then 7 days ago developed fever, headache, went to md few days later  Placed on antibiotics for possible sinus infection, , few days later develiped more fever, sweats

## 2015-06-03 NOTE — ED Provider Notes (Signed)
CSN: [id number]     Arrival date & time 06/03/15  1101 History   First MD Initiated Contact with Patient 06/03/15 1158     Chief Complaint  Patient presents with  . Fever   (Consider location/radiation/quality/duration/timing/severity/associated sxs/prior Treatment) HPI  This a 59 year old gentleman who presents with a fever 102.1 chills, rigors, and headache for approximately 1 week. He states he's had a rash for over a month and the fevers for a week. He's been treated by Dr. Army Melia and given initially Bactrim and then switched to amoxicillin. In addition he's been seen a dermatologist who has biopsied the rash but the results have not been called to him yet. Last night he's had for 5 night sweats and chills and from his description sounds as if they were rigors. He has been very dry and complains of frequent urination with very orange appearing urine. His headaches are very generalized persistent. He complains of no visual disturbances but his eyes are very bloodshot. He has no photophobia or neck stiffness. Dr. Army Melia has been treating him for a sinus infection possible ear infection. He has not been eating because he has a decreased appetite. 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 He states he is not aware of any tick bites recently.  Past Medical History  Diagnosis Date  . Asthma   . History of basal cell cancer    Past Surgical History  Procedure Laterality Date  . Basal cell carcinoma excision     Family History  Problem Relation Age of Onset  . Schizophrenia Mother   . Dementia Father    History  Substance Use Topics  . Smoking status: Never Smoker   . Smokeless tobacco: Not on file  . Alcohol Use: 0.0 oz/week    0 Standard drinks or equivalent per week     Comment: occasional    Review of Systems  Constitutional: Positive for fever, chills, appetite change and fatigue.  HENT: Positive for congestion.   Gastrointestinal: Positive for nausea  and vomiting.  Musculoskeletal: Positive for myalgias and arthralgias.  Neurological: Positive for headaches.  All other systems reviewed and are negative.   Allergies  Review of patient's allergies indicates no known allergies.  Home Medications   Prior to Admission medications   Medication Sig Start Date End Date Taking? Authorizing Provider  amoxicillin (AMOXIL) 875 MG tablet Take 1 tablet (875 mg total) by mouth 2 (two) times daily. 05/30/15  Yes Glean Hess, MD  budesonide-formoterol [facility]) 160-4.5 MCG/ACT inhaler Inhale 1 Inhaler into the lungs 2 (two) times daily. 02/10/15  Yes Historical Provider, MD  Clobetasol Propionate (CLOBEX) 0.05 % lotion Apply 1 application topically 2 (two) times daily. 05/21/15  Yes Glean Hess, MD  fluticasone (FLONASE) 50 MCG/ACT nasal spray Place 2 sprays into both nostrils daily. 05/30/15  Yes Glean Hess, MD   BP 134/71 mmHg  Pulse 108  Temp(Src) 102.1 F (38.9 C) (Tympanic)  Resp 18  Ht 6' (1.829 m)  Wt 225 lb (102.059 kg)  BMI 30.51 kg/m2  SpO2 97% Physical Exam  Constitutional: He is oriented to person, place, and time. He appears well-developed and well-nourished. He appears distressed.  HENT:  Head: Normocephalic and atraumatic.  Right Ear: External ear normal.  Left Ear: External ear normal.  Mucous membranes are dry  Eyes: Pupils are equal, round, and reactive to light. Right eye exhibits no discharge. Left eye exhibits no discharge. No scleral icterus.  Neck: Neck supple.  Cardiovascular: Normal rate and regular rhythm.  Exam reveals no gallop and no friction rub.   Murmur heard. Pulmonary/Chest: Breath sounds normal. No stridor. No respiratory distress. He has no wheezes. He has no rales.  Abdominal: Soft. Bowel sounds are normal. He exhibits no distension. There is no tenderness. There is no rebound and no guarding.  Musculoskeletal: Normal range of motion.  Lymphadenopathy:    He has no cervical adenopathy.   Neurological: He is alert and oriented to person, place, and time.  Skin: Skin is warm and dry. Rash noted.  Psychiatric: He has a normal mood and affect. His behavior is normal. Judgment and thought content normal.  Nursing note and vitals reviewed.   ED Course  Procedures (including critical care time) Labs Review Labs Reviewed  CBC WITH DIFFERENTIAL/PLATELET - Abnormal; Notable for the following:    RBC 3.98 (*)    HCT 37.2 (*)    Platelets 47 (*)    Lymphs Abs 0.2 (*)    All other components within normal limits  SEDIMENTATION RATE - Abnormal; Notable for the following:    Sed Rate 32 (*)    All other components within normal limits  COMPREHENSIVE METABOLIC PANEL - Abnormal; Notable for the following:    Sodium 124 (*)    Chloride 89 (*)    Glucose, Bld 135 (*)    Calcium 8.1 (*)    Total Protein 6.1 (*)    Albumin 2.9 (*)    AST 105 (*)    ALT 161 (*)    Total Bilirubin 2.2 (*)    All other components within normal limits  URINALYSIS COMPLETEWITH MICROSCOPIC ([facility] ONLY) - Abnormal; Notable for the following:    Color, Urine AMBER (*)    Bilirubin Urine 2+ (*)    Ketones, ur 1+ (*)    Hgb urine dipstick TRACE (*)    Protein, ur 100 (*)    Squamous Epithelial / LPF 0-5 (*)    All other components within normal limits  URINE CULTURE  ROCKY MTN SPOTTED FVR ABS PNL(IGG+IGM)  B. BURGDORFI ANTIBODIES    Imaging Review No results found.   MDM   1. Acute hyponatremia   2. Dehydration   3. Fever of undetermined origin    Patient was seen along with Dr. Vanita Panda. He doesn't is dehydration and hyponatremia fevers of unknown origin and his rash with possibility of tick bite fever is recommended that they be seen at the emergency room at [facility] for further evaluation and workup. They're agreeable to this. He was stable at time of his departure. His wife was transporting him by privately owned vehicle.    Lorin Picket, PA-C 06/03/15 [id number]

## 2015-06-03 NOTE — ED Notes (Signed)
Pt sent from urgent care , states he has had HA and fever for over a week and has been on 2 different kinds of abx, night sweats, Na+ 124 and had other testing at urgent care.Marland Kitchen

## 2015-06-03 NOTE — Progress Notes (Signed)
ANTIBIOTIC CONSULT NOTE - INITIAL  Pharmacy Consult for vancomycin/Zosyn Indication: rule out sepsis  No Known Allergies  Patient Measurements: Height: 6' (182.9 cm) Weight: 219 lb 9.6 oz (99.61 kg) IBW/kg (Calculated) : 77.6   Vital Signs: Temp: 98.5 F (36.9 C) (08/02 2032) Temp Source: Oral (08/02 2032) BP: 106/62 mmHg (08/02 2032) Pulse Rate: 92 (08/02 2032) Intake/Output from previous day:   Intake/Output from this shift:    Labs:  Recent Labs  06/03/15 1232  WBC 5.0  HGB 13.2  PLT 47*  CREATININE 0.90   Estimated Creatinine Clearance: 110.7 mL/min (by C-G formula based on Cr of 0.9). No results for input(s): VANCOTROUGH, VANCOPEAK, VANCORANDOM, GENTTROUGH, GENTPEAK, GENTRANDOM, TOBRATROUGH, TOBRAPEAK, TOBRARND, AMIKACINPEAK, AMIKACINTROU, AMIKACIN in the last 72 hours.   Microbiology: No results found for this or any previous visit (from the past 720 hour(s)).  Medical History: Past Medical History  Diagnosis Date  . Asthma   . History of basal cell cancer      Assessment: 59 yo male starting on vanc/Zosyn for sepsis Pt received Zosyn 3.375 g IV x1 at 1827 and vancomycin 1 g IV x1 at 1828  Goal of Therapy:  Vancomycin trough level 15-20 mcg/ml  Plan:  Will order vancomycin 1 g IV q8h Vanc trough before 5th dose 8/4 at 0130 Will need to continue to monitor renal function and culture results  Will order Zosyn 3.375 g IV q8h EI  Rocky Morel 06/03/2015,9:43 PM

## 2015-06-04 ENCOUNTER — Inpatient Hospital Stay (HOSPITAL_COMMUNITY)
Admit: 2015-06-04 | Discharge: 2015-06-04 | Disposition: A | Discharge status: Home / Self Care | Payer: BLUE CROSS/BLUE SHIELD | Attending: Infectious Diseases | Admitting: Infectious Diseases

## 2015-06-04 DIAGNOSIS — I340 Nonrheumatic mitral (valve) insufficiency: Secondary | ICD-10-CM

## 2015-06-04 LAB — CBC
HCT: 37 % — ABNORMAL LOW (ref 40.0–52.0)
Hemoglobin: 12.8 g/dL — ABNORMAL LOW (ref 13.0–18.0)
MCH: 33 pg (ref 26.0–34.0)
MCHC: 34.6 g/dL (ref 32.0–36.0)
MCV: 95.2 fL (ref 80.0–100.0)
PLATELETS: 56 10*3/uL — AB (ref 150–440)
RBC: 3.89 MIL/uL — AB (ref 4.40–5.90)
RDW: 13.7 % (ref 11.5–14.5)
WBC: 4.7 10*3/uL (ref 3.8–10.6)

## 2015-06-04 LAB — BASIC METABOLIC PANEL
Anion gap: 10 (ref 5–15)
BUN: 20 mg/dL (ref 6–20)
CO2: 25 mmol/L (ref 22–32)
Calcium: 8.1 mg/dL — ABNORMAL LOW (ref 8.9–10.3)
Chloride: 95 mmol/L — ABNORMAL LOW (ref 101–111)
Creatinine, Ser: 0.96 mg/dL (ref 0.61–1.24)
GFR calc non Af Amer: >60 mL/min (ref >60)
GLUCOSE: 123 mg/dL — AB (ref 65–99)
Potassium: 3.5 mmol/L (ref 3.5–5.1)
SODIUM: 130 mmol/L — AB (ref 135–145)

## 2015-06-04 LAB — LACTIC ACID, PLASMA: Lactic Acid, Venous: 1.6 mmol/L (ref 0.5–2.0)

## 2015-06-04 LAB — PROTIME-INR
INR: 1.1
Prothrombin Time: 14.4 seconds (ref 11.4–15.0)

## 2015-06-04 LAB — GLUCOSE, CAPILLARY: GLUCOSE-CAPILLARY: 131 mg/dL — AB (ref 65–99)

## 2015-06-04 NOTE — Progress Notes (Signed)
Alert and oriented. Sinus rhythm on tele. BP has been borderline low, stable and afebrile. Patient states he feels "crappy" due to not sleeping for almost five days now, also complaining of an ongoing headache that is not relieved well with medication, will continue to assess. Patient still receiving IV abx and fluids. Cultures have come back negative so far. Will continue to monitor.

## 2015-06-04 NOTE — Consult Note (Signed)
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   He was seen by his PCP and started on bactrim about a week ago and was referred to derm.  He had 2 bxps done and the results are pending.  While on the bactrim he developed the fevers. He stopped the bactrim as thought may be causing some of the symptoms. Reveied amoxicillin  5 days ago for possible sinus infection but sxs persisted. He has a great deal of outdoor exposure, even wading chest deep in a swamp in June.  He has 2 dogs but no other known animal exposure. He does not recall a tick bite and actually tries very hard to avoid using permethrin.   He has not recent travel and no sick contacts.  Since admission he actually is already feeling a little better and feels fever may have broken.  Past Medical History  Diagnosis Date  . Asthma   . History of basal cell cancer    Past Surgical History  Procedure Laterality Date  . Basal cell carcinoma excision     History  Substance Use Topics  . Smoking status: Never Smoker   . Smokeless tobacco: Never Used  . Alcohol Use: 0.0 oz/week    0 Standard drinks or equivalent per week     Comment: occasional   Family History  Problem Relation Age of Onset  . Schizophrenia Mother   . Dementia Father     Allergies: No Known Allergies  Current antibiotics: Antibiotics Given (last 72 hours)    Date/Time Action Medication Dose Rate   06/03/15 2214 Given   doxycycline (VIBRA-TABS) tablet 100 mg 100 mg    06/04/15 0014 Given  [computer would not load to scan med]   piperacillin-tazobactam (ZOSYN) IVPB  3.375 g 3.375 g 12.5 mL/hr   06/04/15 0223 Given   vancomycin (VANCOCIN) IVPB 1000 mg/200 mL premix 1,000 mg 200 mL/hr   06/04/15 0535 Given   piperacillin-tazobactam (ZOSYN) IVPB 3.375 g 3.375 g 12.5 mL/hr   06/04/15 0906 Given   doxycycline (VIBRA-TABS) tablet 100 mg 100 mg    06/04/15 0908 Given   vancomycin (VANCOCIN) IVPB 1000 mg/200 mL premix 1,000 mg 200 mL/hr   06/04/15 1428 Given   piperacillin-tazobactam (ZOSYN) IVPB 3.375 g 3.375 g 12.5 mL/hr   06/04/15 1710 Given   vancomycin (VANCOCIN) IVPB 1000 mg/200 mL premix 1,000 mg 200 mL/hr      MEDICATIONS: . budesonide-formoterol  2 puff Inhalation BID  . clobetasol cream  1 application Topical BID  . docusate sodium  100 mg Oral BID  . doxycycline  100 mg Oral Q12H  . fluticasone  2 spray Each Nare Daily  . piperacillin-tazobactam (ZOSYN)  IV  3.375 g Intravenous 3 times per day  . sodium chloride  3 mL Intravenous Q12H  . vancomycin  1,000 mg Intravenous Q8H    Review of Systems - 11 systems reviewed and negative per HPI   OBJECTIVE: Temp:  [98 F (36.7 C)-98.5 F (36.9 C)] 98.4 F (36.9 C) (08/03 1122) Pulse Rate:  [73-92] 73 (08/03 1829) Resp:  [  18-20] 18 (08/03 1122) BP: (97-124)/(58-71) 119/66 mmHg (08/03 1829) SpO2:  [96 %-97 %] 97 % (08/03 1122) Weight:  [99.383 kg (219 lb 1.6 oz)-99.61 kg (219 lb 9.6 oz)] 99.383 kg (219 lb 1.6 oz) (08/03 0502) Physical Exam  Constitutional: He is oriented to person, place, and time. moderaterly ill apperaing, diaphoretic  HENT: PERRLA, EOMI, anicteric, no conjunctiviti Neck supple Mouth/Throat: Oropharynx is clear and dry. No oropharyngeal exudate.  Cardiovascular: Normal rate, regular rhythm and normal heart sounds. 2.6 sm Pulmonary/Chest: Effort normal and breath sounds normal. No respiratory distress. He has no wheezes.  Abdominal: Soft. Bowel sounds are normal. He exhibits no distension. There is no tenderness.  Lymphadenopathy:  He has no cervical adenopathy.   Neurological: He is alert and oriented to person, place, and time.  Skin: Skin is warm and dry. Diffuse pink eruption over chest and abd.  Palms nml Psychiatric: He has a normal mood and affect. His behavior is normal.    LABS: Results for orders placed or performed during the hospital encounter of 06/03/15 (from the past 48 hour(s))  Blood culture (routine x 2)     Status: None (Preliminary result)   Collection Time: 06/03/15  4:57 PM  Result Value Ref Range   Specimen Description BLOOD LEFT LEFT ANTECUBITAL    Special Requests BOTTLES DRAWN AEROBIC AND ANAEROBIC 6CC    Culture NO GROWTH < 24 HOURS    Report Status PENDING   Lactic acid, plasma     Status: None   Collection Time: 06/03/15  4:57 PM  Result Value Ref Range   Lactic Acid, Venous 1.9 0.5 - 2.0 mmol/L  Blood culture (routine x 2)     Status: None (Preliminary result)   Collection Time: 06/03/15  5:06 PM  Result Value Ref Range   Specimen Description BLOOD LEFT HAND    Special Requests BAA,5CCAER,5CCANA    Culture NO GROWTH < 24 HOURS    Report Status PENDING   Lactic acid, plasma     Status: None   Collection Time: 06/03/15  9:35 PM  Result Value Ref Range   Lactic Acid, Venous 1.5 0.5 - 2.0 mmol/L  Rapid HIV screen (HIV 1/2 Ab+Ag)     Status: None   Collection Time: 06/03/15  9:35 PM  Result Value Ref Range   HIV-1 P24 Antigen - HIV24 NON REACTIVE NON REACTIVE   HIV 1/2 Antibodies NON REACTIVE NON REACTIVE   Interpretation (HIV Ag Ab)      A non reactive test result means that HIV 1 or HIV 2 antibodies and HIV 1 p24 antigen were not detected in the specimen.  CBC with Differential     Status: Abnormal   Collection Time: 06/03/15  9:35 PM  Result Value Ref Range   WBC 4.6 3.8 - 10.6 K/uL   RBC 3.79 (L) 4.40 - 5.90 MIL/uL   Hemoglobin 12.5 (L) 13.0 - 18.0 g/dL   HCT 36.0 (L) 40.0 - 52.0 %   MCV 94.9 80.0 - 100.0 fL   MCH 33.1 26.0 - 34.0 pg   MCHC 34.9 32.0 - 36.0 g/dL   RDW 13.3 11.5 - 14.5 %   Platelets  50 (L) 150 - 440 K/uL   Neutrophils Relative % 87% %   Neutro Abs 4.0 1.4 - 6.5 K/uL   Lymphocytes Relative 9% %   Lymphs Abs 0.4 (L) 1.0 - 3.6 K/uL   Monocytes Relative 4% %   Monocytes Absolute 0.2 0.2 - 1.0 K/uL  Eosinophils Relative 0% %   Eosinophils Absolute 0.0 0 - 0.7 K/uL   Basophils Relative 0% %   Basophils Absolute 0.0 0 - 0.1 K/uL  Comprehensive metabolic panel     Status: Abnormal   Collection Time: 06/03/15  9:35 PM  Result Value Ref Range   Sodium 128 (L) 135 - 145 mmol/L   Potassium 3.5 3.5 - 5.1 mmol/L   Chloride 94 (L) 101 - 111 mmol/L   CO2 24 22 - 32 mmol/L   Glucose, Bld 116 (H) 65 - 99 mg/dL   BUN 18 6 - 20 mg/dL   Creatinine, Ser 1.46 0.61 - 1.24 mg/dL   Calcium 7.6 (L) 8.9 - 10.3 mg/dL   Total Protein 5.5 (L) 6.5 - 8.1 g/dL   Albumin 2.5 (L) 3.5 - 5.0 g/dL   AST 93 (H) 15 - 41 U/L   ALT 146 (H) 17 - 63 U/L   Alkaline Phosphatase 88 38 - 126 U/L   Total Bilirubin 1.9 (H) 0.3 - 1.2 mg/dL   GFR calc non Af Amer >60 >60 mL/min   GFR calc Af Amer >60 >60 mL/min    Comment: (NOTE) The eGFR has been calculated using the CKD EPI equation. This calculation has not been validated in all clinical situations. eGFR's persistently <60 mL/min signify possible Chronic Kidney Disease.    Anion gap 10 5 - 15  Procalcitonin     Status: None   Collection Time: 06/03/15  9:35 PM  Result Value Ref Range   Procalcitonin 1.97 ng/mL    Comment:        Interpretation: PCT > 0.5 ng/mL and <= 2 ng/mL: Systemic infection (sepsis) is possible, but other conditions are known to elevate PCT as well. (NOTE)         ICU PCT Algorithm               Non ICU PCT Algorithm    ----------------------------     ------------------------------         PCT < 0.25 ng/mL                 PCT < 0.1 ng/mL     Stopping of antibiotics            Stopping of antibiotics       strongly encouraged.               strongly encouraged.    ----------------------------      ------------------------------       PCT level decrease by               PCT < 0.25 ng/mL       >= 80% from peak PCT       OR PCT 0.25 - 0.5 ng/mL          Stopping of antibiotics                                             encouraged.     Stopping of antibiotics           encouraged.    ----------------------------     ------------------------------       PCT level decrease by              PCT >= 0.25 ng/mL       < 80% from peak PCT  AND PCT >= 0.5 ng/mL             Continuing antibiotics                                              encouraged.       Continuing antibiotics            encouraged.    ----------------------------     ------------------------------     PCT level increase compared          PCT > 0.5 ng/mL         with peak PCT AND          PCT >= 0.5 ng/mL             Escalation of antibiotics                                          strongly encouraged.      Escalation of antibiotics        strongly encouraged.   Protime-INR     Status: None   Collection Time: 06/03/15  9:35 PM  Result Value Ref Range   Prothrombin Time 14.7 11.4 - 15.0 seconds   INR 1.13   APTT     Status: None   Collection Time: 06/03/15  9:35 PM  Result Value Ref Range   aPTT 35 24 - 36 seconds  Lactic acid, plasma     Status: None   Collection Time: 06/03/15 11:55 PM  Result Value Ref Range   Lactic Acid, Venous 1.6 0.5 - 2.0 mmol/L  Basic metabolic panel     Status: Abnormal   Collection Time: 06/04/15  4:13 AM  Result Value Ref Range   Sodium 130 (L) 135 - 145 mmol/L   Potassium 3.5 3.5 - 5.1 mmol/L   Chloride 95 (L) 101 - 111 mmol/L   CO2 25 22 - 32 mmol/L   Glucose, Bld 123 (H) 65 - 99 mg/dL   BUN 20 6 - 20 mg/dL   Creatinine, Ser 0.96 0.61 - 1.24 mg/dL   Calcium 8.1 (L) 8.9 - 10.3 mg/dL   GFR calc non Af Amer >60 >60 mL/min   GFR calc Af Amer >60 >60 mL/min    Comment: (NOTE) The eGFR has been calculated using the CKD EPI equation. This calculation has not been validated  in all clinical situations. eGFR's persistently <60 mL/min signify possible Chronic Kidney Disease.    Anion gap 10 5 - 15  CBC     Status: Abnormal   Collection Time: 06/04/15  4:13 AM  Result Value Ref Range   WBC 4.7 3.8 - 10.6 K/uL   RBC 3.89 (L) 4.40 - 5.90 MIL/uL   Hemoglobin 12.8 (L) 13.0 - 18.0 g/dL   HCT 37.0 (L) 40.0 - 52.0 %   MCV 95.2 80.0 - 100.0 fL   MCH 33.0 26.0 - 34.0 pg   MCHC 34.6 32.0 - 36.0 g/dL   RDW 13.7 11.5 - 14.5 %   Platelets 56 (L) 150 - 440 K/uL  Protime-INR     Status: None   Collection Time: 06/04/15  4:13 AM  Result Value Ref Range   Prothrombin Time 14.4 11.4 - 15.0 seconds   INR 1.10  Glucose, capillary     Status: Abnormal   Collection Time: 06/04/15  9:02 AM  Result Value Ref Range   Glucose-Capillary 131 (H) 65 - 99 mg/dL   No components found for: ESR, C REACTIVE PROTEIN MICRO: Recent Results (from the past 720 hour(s))  Urine culture     Status: None (Preliminary result)   Collection Time: 06/03/15  1:00 PM  Result Value Ref Range Status   Specimen Description URINE, CLEAN CATCH  Final   Special Requests amoxicillin previously bactrim.  Final   Culture NO GROWTH < 24 HOURS  Final   Report Status PENDING  Incomplete  Blood culture (routine x 2)     Status: None (Preliminary result)   Collection Time: 06/03/15  4:57 PM  Result Value Ref Range Status   Specimen Description BLOOD LEFT LEFT ANTECUBITAL  Final   Special Requests BOTTLES DRAWN AEROBIC AND ANAEROBIC 6CC  Final   Culture NO GROWTH < 24 HOURS  Final   Report Status PENDING  Incomplete  Blood culture (routine x 2)     Status: None (Preliminary result)   Collection Time: 06/03/15  5:06 PM  Result Value Ref Range Status   Specimen Description BLOOD LEFT HAND  Final   Special Requests BAA,5CCAER,5CCANA  Final   Culture NO GROWTH < 24 HOURS  Final   Report Status PENDING  Incomplete    IMAGING: Dg Chest 2 View  06/03/2015   CLINICAL DATA:  Fever and headaches for 5 days.   EXAM: CHEST  2 VIEW  COMPARISON:  None.  FINDINGS: The cardiac silhouette, mediastinal and hilar contours are within normal limits. Prominent interstitial markings could be due to smoking or acute or chronic bronchitis. No focal airspace consolidation to suggest pneumonia. Streaky bibasilar atelectasis. No pleural effusion. The bony thorax is intact.  IMPRESSION: Findings suggest bronchitis, acute versus chronic.  Streaky bibasilar atelectasis but no focal airspace consolidation or pleural effusion.   Electronically Signed   By: Marijo Sanes M.D.   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   He was seen by his PCP and started on bactrim about a week ago and was referred to derm.  He had 2 bxps done and the results are pending.  While on the bactrim he developed the fevers. He stopped the bactrim as thought may be causing some of the symptoms. Reveied amoxicillin  5 days ago for possible sinus infection but sxs persisted. He has a great deal of outdoor exposure, even wading chest deep in a swamp in [place].  He has 2 dogs but no other known animal exposure. He does not recall a tick bite and actually tries very hard to avoid using permethrin.   He has not recent travel and no sick contacts.  Since admission he actually is already feeling a little better and feels fever may have broken. Labs reveal  thrombocytopenia, lymphopenia, hyponatremia and elevated ast alt.HIV negative. [place] negativeHe does have a murmur on exam.  I suspect tick borne illness with erhlichosis or RMSF. Rash is likely unrelated. Diff includes viral infections, leptospirosis, hepatitis, heme malignancy, endocarditis. Recommendations Await bcx Cont current abx Check echo Check tick serologies Follow other  labs  Thank you very much for  allowing me to participate in the care of this patient. Please call with questions.   Cheral Marker. Ola Spurr, MD

## 2015-06-04 NOTE — Progress Notes (Signed)
Pt's skin checked on admission with Clois Dupes.

## 2015-06-04 NOTE — Progress Notes (Signed)
[place] [facility] [place] NAME: John Mathews    MR#:  [id number]  DATE OF BIRTH:  1956/07/30  SUBJECTIVE:  CHIEF COMPLAINT:   Chief Complaint  Patient presents with  . Fever  . Nausea  . Emesis   Patient here due to fever, nausea, vomiting. Noted to have thrombocytopenia, abnormal LFTs and likely a tickborne illness. Feels a bit better today. No fever overnight.  REVIEW OF SYSTEMS:    Review of Systems  Constitutional: Negative for fever, chills and weight loss.  HENT: Negative for congestion, nosebleeds and tinnitus.   Eyes: Negative for blurred vision and double vision.  Respiratory: Negative for cough, hemoptysis, shortness of breath and wheezing.   Cardiovascular: Negative for chest pain, orthopnea, leg swelling and PND.  Gastrointestinal: Negative for nausea, vomiting, abdominal pain and diarrhea.  Genitourinary: Negative for dysuria and hematuria.  Skin: Positive for rash.  Neurological: Negative for dizziness, sensory change, focal weakness, weakness and headaches.  All other systems reviewed and are negative.   Nutrition: Heart healthy Tolerating Diet: Yes Tolerating PT: Ambulatory   DRUG ALLERGIES:  No Known Allergies  VITALS:  Blood pressure 124/71, pulse 83, temperature 98.4 F (36.9 C), temperature source Oral, resp. rate 18, height 6' (1.829 m), weight 99.383 kg (219 lb 1.6 oz), SpO2 97 %.  PHYSICAL EXAMINATION:   Physical Exam  GENERAL:  59 y.o.-year-old patient lying in the bed with no acute distress.  EYES: Pupils equal, round, reactive to light and accommodation. No scleral icterus. Extraocular muscles intact.  HEENT: Head atraumatic, normocephalic. Oropharynx and nasopharynx clear.  NECK:  Supple, no jugular venous distention. No thyroid enlargement, no tenderness.  LUNGS: Normal breath sounds bilaterally, no wheezing, rales, rhonchi. No use of accessory muscles of respiration.  CARDIOVASCULAR: S1,  S2 normal. 2/6 systolic ejection murmur at the right sternal border, no rubs, or gallops.  ABDOMEN: Soft, nontender, nondistended. Bowel sounds present. No organomegaly or mass.  EXTREMITIES: No cyanosis, clubbing or edema b/l.    NEUROLOGIC: Cranial nerves II through XII are intact. No focal Motor or sensory deficits b/l.   PSYCHIATRIC: The patient is alert and oriented x 3. Good affect SKIN: Macular rash on the trunk and neck area. Positive right hand ulcer due to skin cancer.  LABORATORY PANEL:   CBC  Recent Labs Lab 06/04/15 0413  WBC 4.7  HGB 12.8*  HCT 37.0*  PLT 56*   ------------------------------------------------------------------------------------------------------------------  Chemistries   Recent Labs Lab 06/03/15 2135 06/04/15 0413  NA 128* 130*  K 3.5 3.5  CL 94* 95*  CO2 24 25  GLUCOSE 116* 123*  BUN 18 20  CREATININE 0.94 0.96  CALCIUM 7.6* 8.1*  AST 93*  --   ALT 146*  --   ALKPHOS 88  --   BILITOT 1.9*  --    ------------------------------------------------------------------------------------------------------------------  Cardiac Enzymes No results for input(s): TROPONINI in the last 168 hours. ------------------------------------------------------------------------------------------------------------------  RADIOLOGY:  Dg Chest 2 View  06/03/2015   CLINICAL DATA:  Fever and headaches for 5 days.  EXAM: CHEST  2 VIEW  COMPARISON:  None.  FINDINGS: The cardiac silhouette, mediastinal and hilar contours are within normal limits. Prominent interstitial markings could be due to smoking or acute or chronic bronchitis. No focal airspace consolidation to suggest pneumonia. Streaky bibasilar atelectasis. No pleural effusion. The bony thorax is intact.  IMPRESSION: Findings suggest bronchitis, acute versus chronic.  Streaky bibasilar atelectasis but no focal airspace consolidation or pleural effusion.  Electronically Signed   By: Marijo Sanes M.D.   On:  06/03/2015 18:17     ASSESSMENT AND PLAN:   59 year old male with past medical history of asthma, history of basal cancer, who presented to the hospital with fevers, chills, night sweats, headache. Patient was also noted to be thrombocytopenic with abnormal LFTs.   #1 sepsis-patient presented with fever tachycardia. -The likely source of this is underlying take more illness probably [facility] spotted fever given his rash. -Patient empirically has been started on vancomycin, Zosyn, Doxocycline. Vanc/Zosyn can likely be discontinued tomorrow if [facility] remain (-) or OK with infectious disease.  -Continue doxycycline, blood cultures, urine cultures so far negative. -Await tickborne illness titers.  Await infectious disease input.  #2 tickborne illness-likely cause of patient's fever petechial rash, abnormal LFTs, thrombocytopenia. -Continue IV doxycycline. Follow titers for [facility] spotted fever. -Await infectious disease input.  #3 abnormal LFTs-likely secondary to the tickborne illness. -Improving with supportive care and will follow.  #4 thrombocytopenia-also likely secondary to underlying tickborne illness. No evidence of acute bleeding. Avoid heparin products. Follow platelet count.  #5 hyponatremia-likely due to poor by mouth intake and dehydration -Continue to Hydrate with IV fluids and sodium is improving    All the records are reviewed and case discussed with Care Management/Social Workerr. Management plans discussed with the patient, family and they are in agreement.  CODE STATUS: Full  DVT Prophylaxis: Ambulatory  TOTAL TIME TAKING CARE OF THIS PATIENT: 30 minutes.   POSSIBLE D/C IN 1-2 DAYS, DEPENDING ON CLINICAL CONDITION.   Henreitta Leber M.D on 06/04/2015 at 12:28 PM  Between 7am to 6pm - Pager - [phone]  After 6pm go to [email or web address] - password [email or web address] [facility]  Office  [phone]  CC: Primary care physician; Halina Maidens,  MD

## 2015-06-05 LAB — CBC
HEMATOCRIT: 34.7 % — AB (ref 40.0–52.0)
HEMOGLOBIN: 12 g/dL — AB (ref 13.0–18.0)
MCH: 32.9 pg (ref 26.0–34.0)
MCHC: 34.4 g/dL (ref 32.0–36.0)
MCV: 95.7 fL (ref 80.0–100.0)
PLATELETS: 74 10*3/uL — AB (ref 150–440)
RBC: 3.63 MIL/uL — ABNORMAL LOW (ref 4.40–5.90)
RDW: 13.8 % (ref 11.5–14.5)
WBC: 5.1 10*3/uL (ref 3.8–10.6)

## 2015-06-05 LAB — COMPREHENSIVE METABOLIC PANEL
ALT: 129 U/L — ABNORMAL HIGH (ref 17–63)
AST: 73 U/L — AB (ref 15–41)
Albumin: 2.4 g/dL — ABNORMAL LOW (ref 3.5–5.0)
Alkaline Phosphatase: 105 U/L (ref 38–126)
Anion gap: 6 (ref 5–15)
BILIRUBIN TOTAL: 0.8 mg/dL (ref 0.3–1.2)
BUN: 15 mg/dL (ref 6–20)
CO2: 28 mmol/L (ref 22–32)
Calcium: 7.9 mg/dL — ABNORMAL LOW (ref 8.9–10.3)
Chloride: 99 mmol/L — ABNORMAL LOW (ref 101–111)
Creatinine, Ser: 0.77 mg/dL (ref 0.61–1.24)
GLUCOSE: 107 mg/dL — AB (ref 65–99)
Potassium: 3.8 mmol/L (ref 3.5–5.1)
Sodium: 133 mmol/L — ABNORMAL LOW (ref 135–145)
Total Protein: 5.2 g/dL — ABNORMAL LOW (ref 6.5–8.1)

## 2015-06-05 LAB — GLUCOSE, CAPILLARY: GLUCOSE-CAPILLARY: 100 mg/dL — AB (ref 65–99)

## 2015-06-05 LAB — B. BURGDORFI ANTIBODIES: B burgdorferi Ab IgG+IgM: <0.91 {ISR} (ref 0.00–0.90)

## 2015-06-05 LAB — ROCKY MTN SPOTTED FVR ABS PNL(IGG+IGM)
RMSF IgG: POSITIVE — AB
RMSF IgM: 0.23 index (ref 0.00–0.89)

## 2015-06-05 LAB — URINE CULTURE: Culture: NO GROWTH

## 2015-06-05 LAB — VANCOMYCIN, TROUGH: VANCOMYCIN TR: 8 ug/mL — AB (ref 10–20)

## 2015-06-05 LAB — RMSF, IGG, IFA: RMSF, IGG, IFA: 1:256 {titer} — ABNORMAL HIGH

## 2015-06-05 MED ORDER — SODIUM CHLORIDE 0.9 % IV SOLN
2000.0000 mg | Freq: Three times a day (TID) | INTRAVENOUS | Status: DC
  Filled 2015-06-05: qty 2000

## 2015-06-05 MED ORDER — VANCOMYCIN HCL 10 G IV SOLR
1500.0000 mg | Freq: Three times a day (TID) | INTRAVENOUS | Status: DC
  Filled 2015-06-05: qty 1500

## 2015-06-05 MED ORDER — VANCOMYCIN HCL IN DEXTROSE 1-5 GM/200ML-% IV SOLN
1000.0000 mg | INTRAVENOUS | Status: AC
  Administered 2015-06-05: 1000 mg via INTRAVENOUS
  Filled 2015-06-05: qty 200

## 2015-06-05 NOTE — Progress Notes (Signed)
[place] at [place] NAME: John Mathews    MR#:  [id number]  DATE OF BIRTH:  Dec 03, 1955  SUBJECTIVE:  Patient was afebrile overnight. Patient has no complaints. Patient is doing fairly well this morning.  REVIEW OF SYSTEMS:    Review of Systems  Constitutional: Negative for fever, chills, weight loss and malaise/fatigue.  HENT: Negative for congestion, nosebleeds, sore throat and tinnitus.   Eyes: Negative for blurred vision and double vision.  Respiratory: Negative for cough, hemoptysis, shortness of breath and wheezing.   Cardiovascular: Negative for chest pain, palpitations, orthopnea, leg swelling and PND.  Gastrointestinal: Negative for nausea, vomiting, abdominal pain, diarrhea and blood in stool.  Genitourinary: Negative for dysuria and hematuria.  Musculoskeletal: Negative for back pain.  Skin: Positive for rash.  Neurological: Negative for dizziness, tremors, sensory change, focal weakness, weakness and headaches.  Endo/Heme/Allergies: Does not bruise/bleed easily.  All other systems reviewed and are negative.   Nutrition: Heart healthy Tolerating Diet: Yes Tolerating PT: Ambulatory   DRUG ALLERGIES:  No Known Allergies  VITALS:  Blood pressure 118/66, pulse 79, temperature 98 F (36.7 C), temperature source Oral, resp. rate 14, height 6' (1.829 m), weight 100.789 kg (222 lb 3.2 oz), SpO2 96 %.  PHYSICAL EXAMINATION:   Physical Exam  Constitutional: He is oriented to person, place, and time and well-developed, well-nourished, and in no distress. No distress.  HENT:  Head: Normocephalic.  Eyes: No scleral icterus.  Neck: Normal range of motion. Neck supple. No JVD present. No tracheal deviation present.  Cardiovascular: Normal rate and regular rhythm.  Exam reveals no gallop and no friction rub.   Murmur heard. Pulmonary/Chest: Effort normal and breath sounds normal. No respiratory distress. He has no  wheezes. He has no rales. He exhibits no tenderness.  Abdominal: Soft. Bowel sounds are normal. He exhibits no distension and no mass. There is no tenderness. There is no rebound and no guarding.  Musculoskeletal: Normal range of motion. He exhibits no edema.  Neurological: He is alert and oriented to person, place, and time.  Skin: Skin is warm. Rash noted. No erythema.  Psychiatric: Affect and judgment normal.     LABORATORY PANEL:   CBC  Recent Labs Lab 06/05/15 0150  WBC 5.1  HGB 12.0*  HCT 34.7*  PLT 74*   ------------------------------------------------------------------------------------------------------------------  Chemistries   Recent Labs Lab 06/05/15 0150  NA 133*  K 3.8  CL 99*  CO2 28  GLUCOSE 107*  BUN 15  CREATININE 0.77  CALCIUM 7.9*  AST 73*  ALT 129*  ALKPHOS 105  BILITOT 0.8   ------------------------------------------------------------------------------------------------------------------  Cardiac Enzymes No results for input(s): TROPONINI in the last 168 hours. ------------------------------------------------------------------------------------------------------------------  RADIOLOGY:  Dg Chest 2 View  06/03/2015   CLINICAL DATA:  Fever and headaches for 5 days.  EXAM: CHEST  2 VIEW  COMPARISON:  None.  FINDINGS: The cardiac silhouette, mediastinal and hilar contours are within normal limits. Prominent interstitial markings could be due to smoking or acute or chronic bronchitis. No focal airspace consolidation to suggest pneumonia. Streaky bibasilar atelectasis. No pleural effusion. The bony thorax is intact.  IMPRESSION: Findings suggest bronchitis, acute versus chronic.  Streaky bibasilar atelectasis but no focal airspace consolidation or pleural effusion.   Electronically Signed   By: Marijo Sanes M.D.   On: 06/03/2015 18:17     ASSESSMENT AND PLAN:   59 year old male with past medical history of asthma, history of basal cancer, who  presented to the hospital with fevers, chills, night sweats, headache. Patient was also noted to be thrombocytopenic with abnormal LFTs.   1 Sepsis: Patient presented with fever and tachycardia. The sources likely due to tickborne illness. Patient was seen and evaluated by infectious disease physician. RMSF and other tick borne illness titers are still pending. Patient has been afebrile overnight. Dr. Ola Spurr had recommended discontinuing vancomycin and just keeping the patient on doxycycline. Blood and urine cultures are negative thus far. 2-D echocardiogram is pending at this time. 2. Abnormal LFTs: I suspect this is due to an underlying tickborne illness. LFTs have improved.  3. Thrombus cytopenia: This is likely secondary to tickborne illness. There is no evidence of acute bleeding. Platelet count is slowly increasing.  4. Hyponatremia: This is secondary to dehydration with poor by mouth intake and possibly related to tickborne illness. Sodium level slowly improving. BMP is ordered for the a.m.   Management plans discussed with the patient and family and they are in agreement.  CODE STATUS: Full  DVT Prophylaxis: Ambulatory  TOTAL TIME TAKING CARE OF THIS PATIENT: 25 minutes.   POSSIBLE D/C TOMORROW DEPENDING ON CLINICAL CONDITION.   Yomira Flitton M.D on 06/05/2015 at 12:10 PM  Between 7am to 6pm - Pager - [phone]  After 6pm go to [email or web address] - password [email or web address] [facility]  Office  [phone]  CC: Primary care physician; Halina Maidens, MD

## 2015-06-05 NOTE — Progress Notes (Signed)
ANTIBIOTIC CONSULT NOTE - FOLLOW UP  Pharmacy Consult for Vancomyicn Indication: rule out sepsis  No Known Allergies  Patient Measurements: Height: 6' (182.9 cm) Weight: 219 lb 1.6 oz (99.383 kg) IBW/kg (Calculated) : 77.6   Vital Signs: Temp: 98.3 F (36.8 C) (08/03 2028) Temp Source: Oral (08/03 2028) BP: 109/66 mmHg (08/03 2028) Pulse Rate: 72 (08/03 2028) Intake/Output from previous day: 08/03 0701 - 08/04 0700 In: 3073 [P.O.:870; I.V.:1703; IV Piggyback:500] Out: 1850 [Urine:1850] Intake/Output from this shift: Total I/O In: 600 [P.O.:150; I.V.:400; IV Piggyback:50] Out: 450 [Urine:450]  Labs:  Recent Labs  06/03/15 2135 06/04/15 0413 06/05/15 0150  WBC 4.6 4.7 5.1  HGB 12.5* 12.8* 12.0*  PLT 50* 56* 74*  CREATININE 0.94 0.96 0.77   Estimated Creatinine Clearance: 124.4 mL/min (by C-G formula based on Cr of 0.77).  Recent Labs  06/05/15 0150  [place] 8*     Microbiology: Recent Results (from the past 720 hour(s))  Urine culture     Status: None (Preliminary result)   Collection Time: 06/03/15  1:00 PM  Result Value Ref Range Status   Specimen Description URINE, CLEAN CATCH  Final   Special Requests amoxicillin previously bactrim.  Final   Culture NO GROWTH < 24 HOURS  Final   Report Status PENDING  Incomplete  Blood culture (routine x 2)     Status: None (Preliminary result)   Collection Time: 06/03/15  4:57 PM  Result Value Ref Range Status   Specimen Description BLOOD LEFT LEFT ANTECUBITAL  Final   Special Requests BOTTLES DRAWN AEROBIC AND ANAEROBIC 6CC  Final   Culture NO GROWTH < 24 HOURS  Final   Report Status PENDING  Incomplete  Blood culture (routine x 2)     Status: None (Preliminary result)   Collection Time: 06/03/15  5:06 PM  Result Value Ref Range Status   Specimen Description BLOOD LEFT HAND  Final   Special Requests BAA,5CCAER,5CCANA  Final   Culture NO GROWTH < 24 HOURS  Final   Report Status PENDING  Incomplete     Anti-infectives    Start     Dose/Rate Route Frequency Ordered Stop   06/05/15 1030  vancomycin (VANCOCIN) 2,000 mg in sodium chloride 0.9 % 500 mL IVPB     2,000 mg 250 mL/hr over 120 Minutes Intravenous Every 8 hours 06/05/15 0228     06/05/15 0230  vancomycin (VANCOCIN) IVPB 1000 mg/200 mL premix     1,000 mg 200 mL/hr over 60 Minutes Intravenous STAT 06/05/15 0228 06/06/15 0230   06/04/15 0200  vancomycin (VANCOCIN) IVPB 1000 mg/200 mL premix     1,000 mg 200 mL/hr over 60 Minutes Intravenous Every 8 hours 06/03/15 2143     06/04/15 0000  piperacillin-tazobactam (ZOSYN) IVPB 3.375 g     3.375 g 12.5 mL/hr over 240 Minutes Intravenous 3 times per day 06/03/15 2143     06/03/15 2200  doxycycline (VIBRA-TABS) tablet 100 mg     100 mg Oral Every 12 hours 06/03/15 1843     06/03/15 2045  piperacillin-tazobactam (ZOSYN) IVPB 3.375 g  Status:  Discontinued     3.375 g 100 mL/hr over 30 Minutes Intravenous  Once 06/03/15 2034 06/03/15 2140   06/03/15 2045  vancomycin (VANCOCIN) IVPB 1000 mg/200 mL premix  Status:  Discontinued     1,000 mg 200 mL/hr over 60 Minutes Intravenous  Once 06/03/15 2034 06/03/15 2141   06/03/15 1700  piperacillin-tazobactam (ZOSYN) IVPB 3.375 g  3.375 g 100 mL/hr over 30 Minutes Intravenous  Once 06/03/15 1645 06/03/15 1918   06/03/15 1645  vancomycin (VANCOCIN) IVPB 1000 mg/200 mL premix     1,000 mg 200 mL/hr over 60 Minutes Intravenous  Once 06/03/15 1643 06/03/15 1918   06/03/15 1645  piperacillin-tazobactam (ZOSYN) IVPB 3.375 g  Status:  Discontinued     3.375 g 12.5 mL/hr over 240 Minutes Intravenous  Once 06/03/15 1643 06/03/15 1645      Assessment: 59 y/o M with possible on empiric abx for sepsis.   Goal of Therapy:  Vancomycin trough level 15-20 mcg/ml  Plan:  Vancomycin trough is well below goal with the 5th dose but would hesitate to increase to 2000 mg q 8 h due to risk of accumulation so will increase dose to 1500 mg iv q 8 h and  check a trough with the 5th dose.    Ulice Dash D 06/05/2015,2:28 AM

## 2015-06-05 NOTE — Progress Notes (Signed)
[facility] INFECTIOUS DISEASE PROGRESS NOTE Date of Admission:  06/03/2015     ID: John Mathews is a 59 y.o. male with fever, HA, chills, TCP, lymphopenia and elevated ast/alt Active Problems:   Sepsis   Subjective: NO fevers, HA gone, feels a little better. No vomiting, some dizzzyness and nausea when he stands up [employer]  ROS  [employer] systems are reviewed and negative except per hpi  Medications:  Antibiotics Given (last 72 hours)    Date/Time Action Medication Dose Rate   06/03/15 2214 Given   doxycycline (VIBRA-TABS) tablet 100 mg 100 mg    06/04/15 0014 Given  [computer would not load to scan med]   piperacillin-tazobactam (ZOSYN) IVPB 3.375 g 3.375 g 12.5 mL/hr   06/04/15 0223 Given   vancomycin (VANCOCIN) IVPB 1000 mg/200 mL premix 1,000 mg 200 mL/hr   06/04/15 0535 Given   piperacillin-tazobactam (ZOSYN) IVPB 3.375 g 3.375 g 12.5 mL/hr   06/04/15 0906 Given   doxycycline (VIBRA-TABS) tablet 100 mg 100 mg    06/04/15 0908 Given   vancomycin (VANCOCIN) IVPB 1000 mg/200 mL premix 1,000 mg 200 mL/hr   06/04/15 1428 Given   piperacillin-tazobactam (ZOSYN) IVPB 3.375 g 3.375 g 12.5 mL/hr   06/04/15 1710 Given   vancomycin (VANCOCIN) IVPB 1000 mg/200 mL premix 1,000 mg 200 mL/hr   06/04/15 2107 Given   doxycycline (VIBRA-TABS) tablet 100 mg 100 mg    06/04/15 2107 Given   piperacillin-tazobactam (ZOSYN) IVPB 3.375 g 3.375 g 12.5 mL/hr   06/05/15 0222 Given   vancomycin (VANCOCIN) IVPB 1000 mg/200 mL premix 1,000 mg 200 mL/hr   06/05/15 0320 Given   vancomycin (VANCOCIN) IVPB 1000 mg/200 mL premix 1,000 mg 200 mL/hr   06/05/15 0507 Given   piperacillin-tazobactam (ZOSYN) IVPB 3.375 g 3.375 g 12.5 mL/hr     . budesonide-formoterol  2 puff Inhalation BID  . clobetasol cream  1 application Topical BID  . docusate sodium  100 mg Oral BID  . doxycycline  100 mg Oral Q12H  . fluticasone  2 spray Each Nare Daily  . piperacillin-tazobactam (ZOSYN)  IV   3.375 g Intravenous 3 times per day  . sodium chloride  3 mL Intravenous Q12H  . vancomycin  1,500 mg Intravenous Q8H    Objective: Vital signs in last 24 hours: Temp:  [98.3 F (36.8 C)-98.8 F (37.1 C)] 98.8 F (37.1 C) (08/04 0529) Pulse Rate:  [72-83] 73 (08/04 0529) Resp:  [16-22] 22 (08/04 0529) BP: (109-124)/(66-71) 123/69 mmHg (08/04 0529) SpO2:  [96 %-99 %] 99 % (08/04 0529) Weight:  [100.789 kg (222 lb 3.2 oz)] 100.789 kg (222 lb 3.2 oz) (08/04 0529) Constitutional: He is oriented to person, place, and time. moderaterly ill apperaing, diaphoretic  HENT: PERRLA, EOMI, anicteric, no conjunctiviti Neck supple Mouth/Throat: Oropharynx is clear and dry. No oropharyngeal exudate.  Cardiovascular: Normal rate, regular rhythm and normal heart sounds. 2.6 sm Pulmonary/Chest: Effort normal and breath sounds normal. No respiratory distress. He has no wheezes.  Abdominal: Soft. Bowel sounds are normal. He exhibits no distension. There is no tenderness.  Lymphadenopathy:  He has no cervical adenopathy.  Neurological: He is alert and oriented to person, place, and time.  Skin: Skin is warm and dry. Diffuse pink eruption over chest and abd. Palms nml Psychiatric: He has a normal mood and affect. His behavior is normal.    Lab Results  Recent Labs  06/04/15 0413 06/05/15 0150  WBC 4.7 5.1  HGB 12.8*  12.0*  HCT 37.0* 34.7*  NA 130* 133*  K 3.5 3.8  CL 95* 99*  CO2 25 28  BUN 20 15  CREATININE 0.96 0.77    Microbiology: Results for orders placed or performed during the hospital encounter of 06/03/15  Blood culture (routine x 2)     Status: None (Preliminary result)   Collection Time: 06/03/15  4:57 PM  Result Value Ref Range Status   Specimen Description BLOOD LEFT LEFT ANTECUBITAL  Final   Special Requests BOTTLES DRAWN AEROBIC AND ANAEROBIC 6CC  Final   Culture NO GROWTH < 24 HOURS  Final   Report Status PENDING  Incomplete  Blood culture (routine x 2)      Status: None (Preliminary result)   Collection Time: 06/03/15  5:06 PM  Result Value Ref Range Status   Specimen Description BLOOD LEFT HAND  Final   Special Requests BAA,5CCAER,5CCANA  Final   Culture NO GROWTH < 24 HOURS  Final   Report Status PENDING  Incomplete     Studies/Results: Dg Chest 2 View  06/03/2015   CLINICAL DATA:  Fever and headaches for 5 days.  EXAM: CHEST  2 VIEW  COMPARISON:  None.  FINDINGS: The cardiac silhouette, mediastinal and hilar contours are within normal limits. Prominent interstitial markings could be due to smoking or acute or chronic bronchitis. No focal airspace consolidation to suggest pneumonia. Streaky bibasilar atelectasis. No pleural effusion. The bony thorax is intact.  IMPRESSION: Findings suggest bronchitis, acute versus chronic.  Streaky bibasilar atelectasis but no focal airspace consolidation or pleural effusion.   Electronically Signed   By: Marijo Sanes M.D.   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  Has murmur on exam - echo pending. HIV negative. BCX negative.  Tick borne illness labs pending. Labs improving and clinically improving  Rec Cont doxy -  Check rpr. Stop vanco and zosyn Await echo Would try to get derm bxp result from [facility].   Would observe 24 more hours and if labs improving and bcx neg and afebrile dc on 7 days total of Doxy I can see in 4 weeks to recheck convalescent serologies. Thank you very much for the consult. Will follow with you.  [place], Aleister   06/05/2015, 7:45 AM

## 2015-06-06 LAB — RMSF, IGG, IFA: RMSF, IGG, IFA: 1:256 {titer} — ABNORMAL HIGH

## 2015-06-06 LAB — BASIC METABOLIC PANEL
Anion gap: 5 (ref 5–15)
BUN: 12 mg/dL (ref 6–20)
CHLORIDE: 106 mmol/L (ref 101–111)
CO2: 29 mmol/L (ref 22–32)
CREATININE: 0.65 mg/dL (ref 0.61–1.24)
Calcium: 8.2 mg/dL — ABNORMAL LOW (ref 8.9–10.3)
GFR calc Af Amer: >60 mL/min (ref >60)
Glucose, Bld: 95 mg/dL (ref 65–99)
Potassium: 3.5 mmol/L (ref 3.5–5.1)
SODIUM: 140 mmol/L (ref 135–145)

## 2015-06-06 LAB — HEPATITIS PANEL, ACUTE
Hep A IgM: NEGATIVE
Hep B C IgM: NEGATIVE
Hepatitis B Surface Ag: NEGATIVE

## 2015-06-06 LAB — GLUCOSE, CAPILLARY: Glucose-Capillary: 94 mg/dL (ref 65–99)

## 2015-06-06 LAB — ROCKY MTN SPOTTED FVR ABS PNL(IGG+IGM)
RMSF IGG: POSITIVE — AB
RMSF IgM: 0.25 index (ref 0.00–0.89)

## 2015-06-06 LAB — RPR: RPR: NONREACTIVE

## 2015-06-06 MED ORDER — DOXYCYCLINE HYCLATE 100 MG PO TABS: 100.0000 mg | ORAL_TABLET | Freq: Two times a day (BID) | ORAL | Status: DC

## 2015-06-06 NOTE — Progress Notes (Signed)
Discharge instructions given. IV's and tele removed. Education on [place] spotted fever and doxycycline given. Prescription sent to [facility]. Patient has no questions. Will follow up with PCP and Dr. Ola Spurr outpatient.

## 2015-06-06 NOTE — Discharge Summary (Signed)
[facility] [place] NAME: John Mathews    MR#:  [id number]  DATE OF BIRTH:  [birth year]  DATE OF ADMISSION:  06/03/2015 ADMITTING PHYSICIAN: Max Sane, MD  DATE OF DISCHARGE: 06/06/2015 PRIMARY CARE PHYSICIAN: Halina Maidens, MD    ADMISSION DIAGNOSIS:  Hyponatremia [E87.1] Sepsis, due to unspecified organism [A41.9]  DISCHARGE DIAGNOSIS:  Active Problems:   Sepsis   SECONDARY DIAGNOSIS:   Past Medical History  Diagnosis Date  . Asthma   . History of basal cell cancer     HOSPITAL COURSE:  59 year old male with past medical history of asthma, history of basal cancer, who presented to the hospital with fevers, chills, night sweats, headache. Patient was also noted to be thrombocytopenic with abnormal LFTs.   1 Sepsis: Patient presented with fever and tachycardia. The sources likely due to tickborne illness. Patient was seen and evaluated by infectious disease physician. RMSF and other tick borne illness titers are still pending. Patient has been afebrile overnight. Dr. Ola Spurr had recommended discontinuing vancomycin and just keeping the patient on doxycycline for a total of 7 days  Blood and urine cultures are negative thus far.  2-D echocardiogram sowed no evidence of valvular vegetation. Patient will follow up with Dr. Ola Spurr for results of the above-mentioned titers in approximately 2 weeks.  2. Abnormal LFTs: I suspect this is due to an underlying tickborne illness. LFTs have improved.  3. Thrombocytopenia: This is likely secondary to tickborne illness. There is no evidence of acute bleeding. Platelet count is slowly increasing.  4. Hyponatremia: This is secondary to dehydration with poor by mouth intake and possibly related to tickborne illness. Sodium level has normalized.  DISCHARGE CONDITIONS AND DIET:  Patient being discharged home in stable condition on a regular diet  CONSULTS OBTAINED:  Treatment  Team:  Adrian Prows, MD  DRUG ALLERGIES:  No Known Allergies  DISCHARGE MEDICATIONS:   Current Discharge Medication List    START taking these medications   Details  doxycycline (VIBRA-TABS) 100 MG tablet Take 1 tablet (100 mg total) by mouth every 12 (twelve) hours. Qty: 10 tablet, Refills: 0      CONTINUE these medications which have NOT CHANGED   Details  amoxicillin (AMOXIL) 875 MG tablet Take 1 tablet (875 mg total) by mouth 2 (two) times daily. Qty: 20 tablet, Refills: 0   Associated Diagnoses: Acute maxillary sinusitis, recurrence not specified    budesonide-formoterol (SYMBICORT) 160-4.5 MCG/ACT inhaler Inhale 1 puff into the lungs 2 (two) times daily.    Clobetasol Propionate (CLOBEX) 0.05 % lotion Apply 1 application topically 2 (two) times daily. Qty: 59 mL, Refills: 1   Associated Diagnoses: Dermatitis of multiple sites    fluticasone (FLONASE) 50 MCG/ACT nasal spray Place 2 sprays into both nostrils daily. Qty: 16 g, Refills: 6   Associated Diagnoses: Acute maxillary sinusitis, recurrence not specified              Today   CHIEF COMPLAINT:  Patient is doing fairly well this morning. Patient has no complaints. Patient has been afebrile for over 2 days.   VITAL SIGNS:  Blood pressure 128/61, pulse 69, temperature 97.6 F (36.4 C), temperature source Oral, resp. rate 16, height 6' (1.829 m), weight 96.843 kg (213 lb 8 oz), SpO2 95 %.   REVIEW OF SYSTEMS:  Review of Systems  Constitutional: Negative for fever, chills and malaise/fatigue.  HENT: Negative for sore throat.   Eyes: Negative for blurred vision.  Respiratory: Negative for cough, hemoptysis, shortness of breath and wheezing.   Cardiovascular: Negative for chest pain, palpitations and leg swelling.  Gastrointestinal: Negative for nausea, vomiting, abdominal pain, diarrhea and blood in stool.  Genitourinary: Negative for dysuria.  Musculoskeletal: Negative for back pain.   Neurological: Negative for dizziness, tremors and headaches.  Endo/Heme/Allergies: Does not bruise/bleed easily.     PHYSICAL EXAMINATION:  GENERAL:  59 y.o.-year-old patient lying in the bed with no acute distress.  NECK:  Supple, no jugular venous distention. No thyroid enlargement, no tenderness.  LUNGS: Normal breath sounds bilaterally, no wheezing, rales,rhonchi  No use of accessory muscles of respiration.  CARDIOVASCULAR: S1, S2 normal. No murmurs, rubs, or gallops.  ABDOMEN: Soft, non-tender, non-distended. Bowel sounds present. No organomegaly or mass.  EXTREMITIES: No pedal edema, cyanosis, or clubbing.  PSYCHIATRIC: The patient is alert and oriented x 3.  SKIN: No obvious rash, lesion, or ulcer.   DATA REVIEW:   CBC  Recent Labs Lab 06/05/15 0150  WBC 5.1  HGB 12.0*  HCT 34.7*  PLT 74*    Chemistries   Recent Labs Lab 06/05/15 0150 06/06/15 0414  NA 133* 140  K 3.8 3.5  CL 99* 106  CO2 28 29  GLUCOSE 107* 95  BUN 15 12  CREATININE 0.77 0.65  CALCIUM 7.9* 8.2*  AST 73*  --   ALT 129*  --   ALKPHOS 105  --   BILITOT 0.8  --     Cardiac Enzymes No results for input(s): TROPONINI in the last 168 hours.  Microbiology Results  @[email or web address] @  RADIOLOGY:  No results found.    Management plans discussed with the patient and he is in agreement. Stable for discharge home  Patient should follow up with PCP in one week and Dr. Ola Spurr in 2 weeks  CODE STATUS:     Code Status Orders        Start     Ordered   06/03/15 2035  Full code   Continuous     06/03/15 2034    Advance Directive Documentation        Most Recent Value   Type of Advance Directive  Living will   Pre-existing out of facility DNR order (yellow form or pink MOST form)     "MOST" Form in Place?        TOTAL TIME TAKING CARE OF THIS PATIENT: 35 minutes.    Catharine Kettlewell M.D on 06/06/2015 at 10:36 AM  Between 7am to 6pm - Pager - [phone] After 6pm go to  [email or web address] - password [email or web address] [facility]  Office  [phone]  CC: Primary care physician; Halina Maidens, MD

## 2015-06-07 LAB — PARASITE EXAM, BLOOD

## 2015-06-09 LAB — CULTURE, BLOOD (ROUTINE X 2)
CULTURE: NO GROWTH
CULTURE: NO GROWTH
Culture: NO GROWTH
Culture: NO GROWTH

## 2015-06-09 LAB — EHRLICHIA ANTIBODY PANEL
E CHAFFEENSIS AB, IGM: NEGATIVE
E chaffeensis (HGE) Ab, IgG: 1:64 {titer} — ABNORMAL HIGH
E. Chaffeensis (HME) IgM Titer: NEGATIVE
E.Chaffeensis (HME) IgG: 1:64 {titer} — ABNORMAL HIGH

## 2015-06-13 ENCOUNTER — Ambulatory Visit (INDEPENDENT_AMBULATORY_CARE_PROVIDER_SITE_OTHER): Payer: BLUE CROSS/BLUE SHIELD | Admitting: Family Medicine

## 2015-06-13 ENCOUNTER — Encounter: Payer: Self-pay | Admitting: Family Medicine

## 2015-06-13 ENCOUNTER — Inpatient Hospital Stay: Payer: BLUE CROSS/BLUE SHIELD | Admitting: Internal Medicine

## 2015-06-13 VITALS — BP 130/70 | HR 100 | Temp 98.7°F | Ht 72.0 in | Wt 207.0 lb

## 2015-06-13 DIAGNOSIS — Z09 Encounter for follow-up examination after completed treatment for conditions other than malignant neoplasm: Secondary | ICD-10-CM | POA: Diagnosis not present

## 2015-06-13 NOTE — Progress Notes (Signed)
Name: John Mathews   MRN: [id number]    DOB: 05/18/1956   Date:06/13/2015       Progress Note  Subjective  Chief Complaint  Chief Complaint  Patient presents with  . 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 The current episode started 1 to 4 weeks ago. The problem occurs daily. The problem has been gradually improving. The maximum temperature noted was 100 to 100.9 F. Associated symptoms include congestion and headaches. Pertinent negatives include no abdominal pain, chest pain, coughing, diarrhea, ear pain, muscle aches, nausea, rash, sleepiness, sore throat, urinary pain, vomiting or wheezing. Treatments tried: doxycycline. The treatment provided mild relief.    No problem-specific assessment & plan notes found for this encounter.   Past Medical History  Diagnosis Date  . Asthma   . History of basal cell cancer     Past Surgical History  Procedure Laterality Date  . Basal cell carcinoma excision    . Colonoscopy  2012    repeat in 3 years    Family History  Problem Relation Age of Onset  . Schizophrenia Mother   . Dementia Father     Social History   Social History  . Marital Status: Married    Spouse Name: N/A  . Number of Children: N/A  . Years of Education: N/A   Occupational History  . Not on file.   Social History Main Topics  . Smoking status: Never Smoker   . Smokeless tobacco: Never Used  . Alcohol Use: 0.0 oz/week    0 Standard drinks or equivalent per week     Comment: occasional  . Drug Use: No  . Sexual Activity: No   Other Topics Concern  . Not on file   Social History Narrative    No Known Allergies   Review of Systems  Constitutional: Positive for fever. Negative for chills, weight loss and malaise/fatigue.  HENT: Positive for congestion. Negative for ear discharge, ear pain and sore throat.   Eyes: Negative for blurred vision.  Respiratory: Negative for  cough, sputum production, shortness of breath and wheezing.   Cardiovascular: Negative for chest pain, palpitations and leg swelling.  Gastrointestinal: Negative for heartburn, nausea, vomiting, abdominal pain, diarrhea, constipation, blood in stool and melena.  Genitourinary: Negative for dysuria, urgency, frequency and hematuria.  Musculoskeletal: Negative for myalgias, back pain, joint pain and neck pain.  Skin: Negative for rash.  Neurological: Positive for headaches. Negative for dizziness, tingling, sensory change and focal weakness.  Endo/Heme/Allergies: Negative for environmental allergies and polydipsia. Does not bruise/bleed easily.  Psychiatric/Behavioral: Negative for depression and suicidal ideas. The patient is not nervous/anxious and does not have insomnia.      Objective  Filed Vitals:   06/13/15 1105  BP: 130/70  Pulse: 100  Temp: 98.7 F (37.1 C)  TempSrc: Oral  Height: 6' (1.829 m)  Weight: 207 lb (93.895 kg)    Physical Exam  Constitutional: He is oriented to person, place, and time and well-developed, well-nourished, and in no distress.  HENT:  Head: Normocephalic.  Right Ear: External ear normal.  Left Ear: External ear normal.  Nose: Nose normal.  Mouth/Throat: Oropharynx is clear and moist.  Eyes: Conjunctivae and EOM are normal. Pupils are equal, round, and reactive to light. Right eye exhibits no discharge. Left eye exhibits no discharge. No scleral icterus.  Neck: Normal range of motion. Neck supple. No JVD present.  No tracheal deviation present. No thyromegaly present.  Cardiovascular: Normal rate, regular rhythm, normal heart sounds and intact distal pulses.  Exam reveals no gallop and no friction rub.   No murmur heard. Pulmonary/Chest: Breath sounds normal. No respiratory distress. He has no wheezes. He has no rales.  Abdominal: Soft. Bowel sounds are normal. He exhibits no mass. There is no hepatosplenomegaly. There is no tenderness. There is no  rebound, no guarding and no CVA tenderness.  Musculoskeletal: Normal range of motion. He exhibits no edema or tenderness.  Lymphadenopathy:    He has no cervical adenopathy.  Neurological: He is alert and oriented to person, place, and time. He has normal sensation, normal strength, normal reflexes and intact cranial nerves. No cranial nerve deficit.  Skin: Skin is warm. No rash noted.  Psychiatric: Mood and affect normal.  Nursing note and vitals reviewed.     Assessment & Plan  Problem List Items Addressed This Visit    None    Visit Diagnoses    Hospital discharge follow-up    -  Primary         Dr. Otilio Miu [facility]  06/13/2015

## 2015-06-13 NOTE — Patient Instructions (Signed)
[place] Spotted Fever [place] Spotted Fever (RMSF) is the oldest known tick-borne disease of people in the [place]. This disease was named because it was first described among people in the [facility] area who had an illness characterized by a rash with red-purple-black spots. This disease is caused by a rickettsia (Rickettsia rickettsii), a bacteria carried by the tick. The [facility] wood tick and the American dog tick acquire and transmit the RMSF bacteria (pictures NOT actual size). When a larval, nymphal, or adult tick feeds on an infected rodent or larger animal, the tick can become infected. Infected adult ticks then feed on people who may then get RMSF. The tick transmits the disease to humans during a prolonged period of feeding that lasts many hours, days, or even a couple weeks. The bite is painless and frequently goes unnoticed. An infected male tick may also pass the rickettsial bacteria to her eggs that then may mature to be infected adult ticks. The rickettsia that causes RMSF can also get into a person's body through damaged skin. A tick bite is not necessary. People can get RMSF if they crush a tick and get its blood or body fluids on their skin through a small cut or sore.  DIAGNOSIS Diagnosis is made by laboratory tests.  TREATMENT Treatment is with antibiotics (medications that kill rickettsia and other bacteria). Immediate treatment usually prevents death. GEOGRAPHIC RANGE This disease was reported only in the [facility] until 1931. RMSF has more recently been described among individuals in all states except [place], [place], and [place]. The highest reported incidences of RMSF now occur among residents of [place], [place], [place], and the [place]. TIME OF YEAR  Most cases are diagnosed during late spring and summer when ticks are most active. However, especially in the warmer [place] states, a few cases occur during the winter. SYMPTOMS    Symptoms of RMSF begin from 2 to 14 days after a tick bite. The most common early symptoms are fever, muscle aches, and headache followed by nausea (feeling sick to your stomach) or vomiting.  The RMSF rash is typically delayed until 3 or more days after symptom onset, and eventually develops in 9 of 10 infected patients by the fifth day of illness. If the disease is not treated it can cause death. If you get a fever, headache, muscle aches, rash, nausea, or vomiting within 2 weeks of a possible tick bite or exposure, you should see your caregiver immediately. PREVENTION Ticks prefer to hide in shady, moist ground litter. They can often be found above the ground clinging to tall grass, brush, shrubs and low tree branches. They also inhabit lawns and gardens, especially at the edges of woodlands and around old stone walls. Within the areas where ticks generally live, no naturally vegetated area can be considered completely free of infected ticks. The best precaution against RMSF is to avoid contact with soil, leaf litter, and vegetation as much as possible in tick-infested areas. For those who enjoy gardening or walking in their yards, clear brush and mow tall grass around houses and at the edges of gardens. This may help reduce the tick population in the immediate area. Applications of chemical insecticides by a licensed professional in the spring (late May) and fall (September) will also control ticks, especially in heavily infested areas. Treatment will never get rid of all the ticks. Getting rid of small animal populations that host ticks will also decrease the tick population. When working in the garden, [employer]  shrubs, or handling soil and vegetation, wear light-colored protective clothing and gloves. Spot-check often to prevent ticks from reaching the skin. Ticks cannot jump or fly. They will not drop from an above-ground perch onto a passing animal. Once a tick gains access to human skin it climbs  upward until it reaches a more protected area. For example, the back of the knee, groin, navel, armpit, ears, or nape of the neck. It then begins the slow process of embedding itself in the skin. Campers, hikers, field workers, and others who spend time in wooded, brushy, or tall grassy areas can avoid exposure to ticks by using the following precautions:  Wear light-colored clothing with a tight weave to spot ticks more easily and prevent contact with the skin.  Wear long pants tucked into socks, long-sleeved shirts tucked into pants and enclosed shoes or boots along with insect repellent.  Spray clothes with insect repellent containing either DEET or Permethrin. Only DEET can be used on exposed skin. Follow the manufacturer's directions carefully.  Wear a hat and keep long hair pulled back.  Stay on cleared, well-worn trails whenever possible.  Spot-check yourself and others often for the presence of ticks on clothes. If you find one, there are likely to be others. Check thoroughly.  Remove clothes after leaving tick-infested areas. If possible, wash them to eliminate any unseen ticks. Check yourself, your children and any pets from head to toe for the presence of ticks.  Shower and shampoo. You can greatly reduce your chances of contracting RMSF if you remove attached ticks as soon as possible. Regular checks of the body, including all body sites covered by hair (head, armpits, genitals), allow removal of the tick before rickettsial transmission. To remove an attached tick, use a forceps or tweezers to detach the intact tick without leaving mouth parts in the skin. The tick bite wound should be cleansed after tick removal. Remember the most common symptoms of RMSF are fever, muscle aches, headache, and nausea or vomiting with a later onset of rash. If you get these symptoms after a tick bite and while living in an area where RMSF is found, RMSF should be suspected. If the disease is not  treated, it can cause death. See your caregiver immediately if you get these symptoms. Do this even if not aware of a tick bite. Document Released: 01/30/2001 Document Revised: 03/04/2014 Document Reviewed: 09/22/2009 [facility] Patient Information 2015 [employer], [place]. This information is not intended to replace advice given to you by your health care provider. Make sure you discuss any questions you have with your health care provider.

## 2015-06-20 DIAGNOSIS — E871 Hypo-osmolality and hyponatremia: Secondary | ICD-10-CM | POA: Insufficient documentation

## 2015-06-20 DIAGNOSIS — D696 Thrombocytopenia, unspecified: Secondary | ICD-10-CM | POA: Insufficient documentation

## 2015-06-20 DIAGNOSIS — R945 Abnormal results of liver function studies: Secondary | ICD-10-CM | POA: Insufficient documentation

## 2015-06-20 DIAGNOSIS — R7989 Other specified abnormal findings of blood chemistry: Secondary | ICD-10-CM | POA: Insufficient documentation

## 2015-06-25 ENCOUNTER — Encounter: Payer: Self-pay | Admitting: Infectious Diseases

## 2015-08-18 ENCOUNTER — Ambulatory Visit (INDEPENDENT_AMBULATORY_CARE_PROVIDER_SITE_OTHER): Payer: BLUE CROSS/BLUE SHIELD | Admitting: Internal Medicine

## 2015-08-18 ENCOUNTER — Encounter: Payer: Self-pay | Admitting: Internal Medicine

## 2015-08-18 VITALS — BP 110/78 | HR 60 | Ht 72.0 in | Wt 216.8 lb

## 2015-08-18 DIAGNOSIS — Z0000 Encounter for general adult medical examination without abnormal findings: Secondary | ICD-10-CM | POA: Diagnosis not present

## 2015-08-18 DIAGNOSIS — R768 Other specified abnormal immunological findings in serum: Secondary | ICD-10-CM | POA: Insufficient documentation

## 2015-08-18 DIAGNOSIS — A7740 Ehrlichiosis, unspecified: Secondary | ICD-10-CM

## 2015-08-18 DIAGNOSIS — D696 Thrombocytopenia, unspecified: Secondary | ICD-10-CM

## 2015-08-18 DIAGNOSIS — R7689 Other specified abnormal immunological findings in serum: Secondary | ICD-10-CM

## 2015-08-18 DIAGNOSIS — Z125 Encounter for screening for malignant neoplasm of prostate: Secondary | ICD-10-CM | POA: Diagnosis not present

## 2015-08-18 DIAGNOSIS — D126 Benign neoplasm of colon, unspecified: Secondary | ICD-10-CM | POA: Diagnosis not present

## 2015-08-18 DIAGNOSIS — E785 Hyperlipidemia, unspecified: Secondary | ICD-10-CM

## 2015-08-18 DIAGNOSIS — J4520 Mild intermittent asthma, uncomplicated: Secondary | ICD-10-CM | POA: Diagnosis not present

## 2015-08-18 LAB — POCT URINALYSIS DIPSTICK
Bilirubin, UA: NEGATIVE
Blood, UA: NEGATIVE
GLUCOSE UA: NEGATIVE
Ketones, UA: NEGATIVE
Leukocytes, UA: NEGATIVE
Nitrite, UA: NEGATIVE
Protein, UA: NEGATIVE
SPEC GRAV UA: 1.015
UROBILINOGEN UA: 0.2
pH, UA: 5

## 2015-08-18 NOTE — Progress Notes (Signed)
Date:  08/18/2015   Name:  John Mathews   DOB:  November 16, 1955   MRN:  [id number]   Chief Complaint: Annual Exam John Mathews is a 59 y.o. male who presents today for his Complete Annual Exam. He feels well. He reports exercising at work. He reports he is sleeping well. He was diagnosed with Erlichiosis and has completed course of antibiotics and feels well. Asthma -  Symptoms are mild and intermittent and well controlled on Symbicort. He's had his flu vaccine this year. He's had a pneumococcal 23 several years ago.  Tubular adenoma -  Noted on colonoscopy in 2012. He's overdue for follow-up colonoscopy and is encouraged to schedule this.  He denies any change in bowel habits or blood in stool.    Review of Systems  Constitutional: Negative for fever, chills and diaphoresis.  HENT: Negative for hearing loss.   Eyes: Negative for visual disturbance.  Respiratory: Negative for cough, choking and shortness of breath.   Cardiovascular: Negative for chest pain, palpitations and leg swelling.  Gastrointestinal: Negative for abdominal pain, diarrhea, constipation and blood in stool.  Endocrine: Negative for polydipsia and polyuria.  Genitourinary: Negative for dysuria, urgency, frequency, hematuria, decreased urine volume and difficulty urinating.  Musculoskeletal: Negative for arthralgias and gait problem.  Skin: Positive for wound. Negative for color change and rash.  Neurological: Negative for tremors, syncope, light-headedness and headaches.  Hematological: Negative for adenopathy. Does not bruise/bleed easily.  Psychiatric/Behavioral: Negative for sleep disturbance and dysphoric mood.    Patient Active Problem List   Diagnosis Date Noted  . Abnormal LFTs 06/20/2015  . Below normal amount of sodium in the blood 06/20/2015  . Thrombocytopenia ([place]) 06/20/2015  . Sepsis ([place]) 06/03/2015  . 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 AB (asthmatic  bronchitis) 05/21/2015  . Accumulation of fluid in tissues 05/21/2015  . History of colon polyps 05/21/2015  . Dermatitis of multiple sites 05/21/2015    Prior to Admission medications   Medication Sig Start Date End Date Taking? Authorizing Provider  budesonide-formoterol (SYMBICORT) 160-4.5 MCG/ACT inhaler Inhale 1 puff into the lungs 2 (two) times daily.   Yes Historical Provider, MD  Clobetasol Propionate (CLOBEX) 0.05 % lotion Apply 1 application topically 2 (two) times daily. 05/21/15  Yes Glean Hess, MD    No Known Allergies  Past Surgical History  Procedure Laterality Date  . Basal cell carcinoma excision    . Colonoscopy  2012    repeat in 3 years    Social History  Substance Use Topics  . Smoking status: Never Smoker   . Smokeless tobacco: Never Used  . Alcohol Use: 0.0 oz/week    0 Standard drinks or equivalent per week     Comment: occasional     Medication list has been reviewed and updated.   Physical Exam  Constitutional: He is oriented to person, place, and time. He appears well-developed. No distress.  HENT:  Head: Normocephalic and atraumatic.  Eyes: Conjunctivae are normal. Right eye exhibits no discharge. Left eye exhibits no discharge. No scleral icterus.  Neck: Normal range of motion. Neck supple. No thyromegaly present.  Cardiovascular: Normal rate, regular rhythm and normal heart sounds.   Pulmonary/Chest: Effort normal and breath sounds normal. No respiratory distress. He has no wheezes.  Abdominal: Soft. He exhibits no mass. There is no tenderness. There is no rebound.  Musculoskeletal: Normal range of motion. He exhibits no edema or tenderness.  Neurological: He is alert and oriented  to person, place, and time. He has normal reflexes.  Skin: Skin is warm and dry. No rash noted.     Scattered AK and SK over face and arms  Psychiatric: He has a normal mood and affect. His behavior is normal. Thought content normal.  Nursing note and  vitals reviewed.   BP 110/78 mmHg  Pulse 60  Ht 6' (1.829 m)  Wt 216 lb 12.8 oz (98.34 kg)  BMI 29.40 kg/m2  Assessment and Plan: 1. Annual physical exam - POCT urinalysis dipstick - TSH  2. Prostate cancer screening DRE deferred due to lack of symptoms - PSA  3. Hyperlipidemia Continue diet and exercise - Lipid panel  4. Thrombocytopenia ([place]) Noted during evaluation of Erlichiosis Last CBC was normal  5. AB (asthmatic bronchitis), mild intermittent, uncomplicated Stable on current therapy  6. Tubular adenoma of colon Patient instructed to call for follow-up colonoscopy   Halina Maidens, MD [facility]  08/18/2015

## 2015-08-18 NOTE — Patient Instructions (Signed)
Please schedule your follow up colonoscopy in the near future.

## 2015-08-19 LAB — LIPID PANEL
Chol/HDL Ratio: 4.8 ratio units (ref 0.0–5.0)
Cholesterol, Total: 244 mg/dL — ABNORMAL HIGH (ref 100–199)
HDL: 51 mg/dL (ref >39)
LDL Calculated: 170 mg/dL — ABNORMAL HIGH (ref 0–99)
Triglycerides: 113 mg/dL (ref 0–149)
VLDL Cholesterol Cal: 23 mg/dL (ref 5–40)

## 2015-08-19 LAB — PSA: Prostate Specific Ag, Serum: 0.5 ng/mL (ref 0.0–4.0)

## 2015-08-19 LAB — TSH: TSH: 0.762 u[IU]/mL (ref 0.450–4.500)

## 2015-09-09 LAB — HM COLONOSCOPY

## 2016-02-24 ENCOUNTER — Other Ambulatory Visit: Payer: Self-pay | Admitting: Internal Medicine

## 2016-04-07 DIAGNOSIS — L570 Actinic keratosis: Secondary | ICD-10-CM | POA: Diagnosis not present

## 2016-04-07 DIAGNOSIS — Z08 Encounter for follow-up examination after completed treatment for malignant neoplasm: Secondary | ICD-10-CM | POA: Diagnosis not present

## 2016-04-07 DIAGNOSIS — Z1283 Encounter for screening for malignant neoplasm of skin: Secondary | ICD-10-CM | POA: Diagnosis not present

## 2016-04-07 DIAGNOSIS — Z85828 Personal history of other malignant neoplasm of skin: Secondary | ICD-10-CM | POA: Diagnosis not present

## 2016-07-14 DIAGNOSIS — Z23 Encounter for immunization: Secondary | ICD-10-CM | POA: Diagnosis not present

## 2016-08-19 ENCOUNTER — Encounter: Payer: BLUE CROSS/BLUE SHIELD | Admitting: Internal Medicine

## 2016-08-19 DIAGNOSIS — J4520 Mild intermittent asthma, uncomplicated: Secondary | ICD-10-CM | POA: Insufficient documentation

## 2016-08-19 DIAGNOSIS — E782 Mixed hyperlipidemia: Secondary | ICD-10-CM | POA: Insufficient documentation

## 2016-10-20 ENCOUNTER — Encounter: Payer: Self-pay | Admitting: Internal Medicine

## 2016-10-20 ENCOUNTER — Ambulatory Visit (INDEPENDENT_AMBULATORY_CARE_PROVIDER_SITE_OTHER): Payer: BLUE CROSS/BLUE SHIELD | Admitting: Internal Medicine

## 2016-10-20 VITALS — BP 142/82 | HR 78 | Temp 98.1°F | Ht 72.0 in | Wt 231.0 lb

## 2016-10-20 DIAGNOSIS — R7881 Bacteremia: Secondary | ICD-10-CM

## 2016-10-20 DIAGNOSIS — I340 Nonrheumatic mitral (valve) insufficiency: Secondary | ICD-10-CM

## 2016-10-20 DIAGNOSIS — J4520 Mild intermittent asthma, uncomplicated: Secondary | ICD-10-CM | POA: Diagnosis not present

## 2016-10-20 NOTE — Progress Notes (Signed)
    Date:  10/20/2016   Name:  John Mathews   DOB:  [birth year]   MRN:  [id number]   Chief Complaint: No chief complaint on file. Patient donated blood 2 weeks ago to [facility].  They called him to say that his blood cultured positive for Serratia Marcensens. He was told to come here for follow up.  Review of Systems  Constitutional: Negative for chills, fatigue and fever.  Respiratory: Positive for wheezing. Negative for chest tightness and shortness of breath.   Cardiovascular: Negative for chest pain and palpitations.  Neurological: Negative for dizziness and headaches.  Hematological: Negative for adenopathy.    Patient Active Problem List   Diagnosis Date Noted  . 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 Mild intermittent asthma without complication [id number]  . Abnormal LFTs 06/20/2015  . Thrombocytopenia ([place]) 06/20/2015  . Accumulation of fluid in tissues 05/21/2015  . Tubular adenoma of colon 05/21/2015  . Dermatitis of multiple sites 05/21/2015    Prior to Admission medications   Medication Sig Start Date End Date Taking? Authorizing Provider  Clobetasol Propionate (CLOBEX) 0.05 % lotion Apply 1 application topically 2 (two) times daily. 05/21/15   Glean Hess, MD  SYMBICORT 160-4.5 MCG/ACT inhaler INHALE 1 PUFF BY MOUTH TWICE A DAY 02/24/16   Glean Hess, MD    Allergies  Allergen Reactions  . Diphenhydramine Hcl Other (See Comments)    hyperactivity    Past Surgical History:  Procedure Laterality Date  . BASAL CELL CARCINOMA EXCISION    . COLONOSCOPY  2012   repeat in 3 years    Social History  Substance Use Topics  . Smoking status: Never Smoker  . Smokeless tobacco: Never Used  . Alcohol use 0.0 oz/week     Comment: occasional     Medication list has been reviewed and updated.   Physical Exam  Constitutional: He is oriented to person, place, and time. He appears well-developed. No distress.  HENT:  Head: Normocephalic and  atraumatic.  Cardiovascular: Normal rate and regular rhythm.   Murmur heard.  Systolic murmur is present with a grade of 1/6  Pulmonary/Chest: Effort normal and breath sounds normal. No respiratory distress. He has no wheezes.  Musculoskeletal: Normal range of motion.  Neurological: He is alert and oriented to person, place, and time.  Skin: Skin is warm, dry and intact. No rash noted.  Psychiatric: He has a normal mood and affect. His behavior is normal. Thought content normal.  Nursing note and vitals reviewed.   BP (!) 142/82   Pulse 78   Temp 98.1 F (36.7 C)   Ht 6' (1.829 m)   Wt 231 lb (104.8 kg)   SpO2 97%   BMI 31.33 kg/m   Assessment and Plan: 1. Blood bacterial culture positive Suspect contamination since no sx but will get CBC - CBC with Differential/Platelet  2. Mild intermittent asthma without complication stable  3. Mild mitral regurgitation Mild on exam and by ECHO No antibiotic prophylaxis needed   Halina Maidens, MD [facility]  10/20/2016

## 2016-10-21 LAB — CBC WITH DIFFERENTIAL/PLATELET
BASOS: 0 %
Basophils Absolute: 0 10*3/uL (ref 0.0–0.2)
EOS (ABSOLUTE): 0.4 10*3/uL (ref 0.0–0.4)
EOS: 7 %
HEMATOCRIT: 42.6 % (ref 37.5–51.0)
Hemoglobin: 14.7 g/dL (ref 13.0–17.7)
IMMATURE GRANS (ABS): 0 10*3/uL (ref 0.0–0.1)
IMMATURE GRANULOCYTES: 0 %
LYMPHS: 31 %
Lymphocytes Absolute: 1.8 10*3/uL (ref 0.7–3.1)
MCH: 32.8 pg (ref 26.6–33.0)
MCHC: 34.5 g/dL (ref 31.5–35.7)
MCV: 95 fL (ref 79–97)
MONOS ABS: 0.5 10*3/uL (ref 0.1–0.9)
Monocytes: 9 %
NEUTROS PCT: 53 %
Neutrophils Absolute: 3 10*3/uL (ref 1.4–7.0)
PLATELETS: 206 10*3/uL (ref 150–379)
RBC: 4.48 x10E6/uL (ref 4.14–5.80)
RDW: 13.1 % (ref 12.3–15.4)
WBC: 5.7 10*3/uL (ref 3.4–10.8)

## 2017-01-04 ENCOUNTER — Ambulatory Visit (INDEPENDENT_AMBULATORY_CARE_PROVIDER_SITE_OTHER): Payer: BLUE CROSS/BLUE SHIELD | Admitting: Internal Medicine

## 2017-01-04 ENCOUNTER — Encounter: Payer: Self-pay | Admitting: Internal Medicine

## 2017-01-04 VITALS — BP 128/84 | HR 62 | Ht 72.0 in | Wt 225.6 lb

## 2017-01-04 DIAGNOSIS — Z125 Encounter for screening for malignant neoplasm of prostate: Secondary | ICD-10-CM

## 2017-01-04 DIAGNOSIS — I340 Nonrheumatic mitral (valve) insufficiency: Secondary | ICD-10-CM

## 2017-01-04 DIAGNOSIS — Z0000 Encounter for general adult medical examination without abnormal findings: Secondary | ICD-10-CM

## 2017-01-04 DIAGNOSIS — R945 Abnormal results of liver function studies: Secondary | ICD-10-CM

## 2017-01-04 DIAGNOSIS — K219 Gastro-esophageal reflux disease without esophagitis: Secondary | ICD-10-CM | POA: Diagnosis not present

## 2017-01-04 DIAGNOSIS — R7989 Other specified abnormal findings of blood chemistry: Secondary | ICD-10-CM

## 2017-01-04 DIAGNOSIS — J4520 Mild intermittent asthma, uncomplicated: Secondary | ICD-10-CM | POA: Diagnosis not present

## 2017-01-04 DIAGNOSIS — E782 Mixed hyperlipidemia: Secondary | ICD-10-CM

## 2017-01-04 DIAGNOSIS — D696 Thrombocytopenia, unspecified: Secondary | ICD-10-CM

## 2017-01-04 LAB — POCT URINALYSIS DIPSTICK
BILIRUBIN UA: NEGATIVE
GLUCOSE UA: NEGATIVE
Ketones, UA: NEGATIVE
Leukocytes, UA: NEGATIVE
Nitrite, UA: NEGATIVE
Protein, UA: NEGATIVE
RBC UA: NEGATIVE
Urobilinogen, UA: 0.2
pH, UA: 5

## 2017-01-04 NOTE — Progress Notes (Signed)
Date:  01/04/2017   Name:  John Mathews   DOB:  25-Jul-1956   MRN:  [id number]   Chief Complaint: Annual Exam John Mathews is a 61 y.o. male who presents today for his Complete Annual Exam. He feels well. 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 He reports he is sleeping well.   Asthma  He complains of shortness of breath. There is no wheezing. This is a chronic problem. Pertinent negatives include no appetite change, chest pain, headaches, myalgias or trouble swallowing. His symptoms are alleviated by steroid inhaler. Improvement on treatment: no use of albuterol inhaler. His past medical history is significant for asthma.  Gastroesophageal Reflux  He reports no abdominal pain, no chest pain, no choking or no wheezing. This is a new problem. The current episode started 1 to 4 weeks ago. The problem occurs occasionally. Nothing aggravates the symptoms. Pertinent negatives include no fatigue. He has tried a diet change (eating less at each meal) for the symptoms. The treatment provided mild relief.    Review of Systems  Constitutional: Negative for appetite change, chills, diaphoresis, fatigue and unexpected weight change.  HENT: Negative for hearing loss, tinnitus, trouble swallowing and voice change.   Eyes: Negative for visual disturbance.  Respiratory: Positive for shortness of breath. Negative for apnea, choking and wheezing.   Cardiovascular: Negative for chest pain, palpitations and leg swelling.  Gastrointestinal: Negative for abdominal pain, blood in stool, constipation and diarrhea.       Occasional heartburn over past few weeks  Genitourinary: Negative for difficulty urinating, dysuria and frequency.  Musculoskeletal: Positive for arthralgias (general joint aches and pains). Negative for back pain and myalgias.  Skin: Negative for color change and rash.  Allergic/Immunologic: Positive for environmental allergies.  Neurological:  Negative for dizziness, tremors, syncope, weakness, light-headedness and headaches.  Hematological: Negative for adenopathy. Does not bruise/bleed easily.  Psychiatric/Behavioral: Negative for dysphoric mood and sleep disturbance.    Patient Active Problem List   Diagnosis Date Noted  . Gastroesophageal reflux disease 01/04/2017  . Mild mitral regurgitation 10/20/2016  . 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 Mild intermittent asthma without complication 99991111  . Abnormal LFTs 06/20/2015  . Thrombocytopenia ([place]) 06/20/2015  . Accumulation of fluid in tissues 05/21/2015  . Tubular adenoma of colon 05/21/2015  . Dermatitis of multiple sites 05/21/2015    Prior to Admission medications   Medication Sig Start Date End Date Taking? Authorizing Provider  Clobetasol Propionate (CLOBEX) 0.05 % lotion Apply 1 application topically 2 (two) times daily. 05/21/15  Yes Glean Hess, MD  SYMBICORT 160-4.5 MCG/ACT inhaler INHALE 1 PUFF BY MOUTH TWICE A DAY 02/24/16  Yes Glean Hess, MD    Allergies  Allergen Reactions  . Diphenhydramine Hcl Other (See Comments)    hyperactivity    Past Surgical History:  Procedure Laterality Date  . BASAL CELL CARCINOMA EXCISION    . COLONOSCOPY  2012   repeat in 3 years    Social History  Substance Use Topics  . Smoking status: Never Smoker  . Smokeless tobacco: Never Used  . Alcohol use 0.0 oz/week     Comment: occasional     Medication list has been reviewed and updated.   Physical Exam  Constitutional: He is oriented to person, place, and time. He appears well-developed and well-nourished.  HENT:  Head: Normocephalic.  Right Ear: Tympanic membrane, external ear and ear canal normal.  Left Ear: Tympanic  membrane, external ear and ear canal normal.  Nose: Nose normal.  Mouth/Throat: Uvula is midline and oropharynx is clear and moist.  Eyes: Conjunctivae and EOM are normal. Pupils are equal, round, and reactive to light.  Neck:  Normal range of motion. Neck supple. Carotid bruit is not present. No thyromegaly present.  Cardiovascular: Normal rate, regular rhythm and intact distal pulses.   Murmur heard.  Systolic murmur is present with a grade of 1/6  Murmur of mild MR unchanged  Pulmonary/Chest: Effort normal and breath sounds normal. He has no wheezes. Right breast exhibits no mass. Left breast exhibits no mass.  Abdominal: Soft. Normal appearance and bowel sounds are normal. There is no hepatosplenomegaly. There is no tenderness.  Musculoskeletal: Normal range of motion.  Lymphadenopathy:    He has no cervical adenopathy.  Neurological: He is alert and oriented to person, place, and time. He has normal reflexes.  Skin: Skin is warm, dry and intact.  Sun damage lesion scattered over face and arms  Psychiatric: He has a normal mood and affect. His speech is normal and behavior is normal. Judgment and thought content normal.  Nursing note and vitals reviewed.   BP 128/84   Pulse 62   Ht 6' (1.829 m)   Wt 225 lb 9.6 oz (102.3 kg)   SpO2 97%   BMI 30.60 kg/m   Assessment and Plan: 1. Annual physical exam - POCT urinalysis dipstick  2. Prostate cancer screening DRE deferred to lack of symptoms - PSA  3. Mild intermittent asthma without complication Stable [id number] up to date  4. Mild mitral regurgitation Stable, asx  5. Mixed hyperlipidemia Will advise on medications - Lipid panel  6. Abnormal LFTs monitor - Comprehensive metabolic panel  7. Thrombocytopenia (HCC) Stable, no bleeding issues - CBC with Differential/Platelet  8. Gastroesophageal reflux disease, esophagitis presence not specified Intermittent - recommend H2 blocker as needed   No orders of the defined types were placed in this encounter.   Halina Maidens, MD [facility]  01/04/2017

## 2017-01-04 NOTE — Patient Instructions (Addendum)
Health Maintenance  Topic Date Due  . HIV Screening  01/04/2022 (Originally 06/07/1971)  . TETANUS/TDAP  04/19/2021  . COLONOSCOPY  09/08/2025  . INFLUENZA VACCINE  Addressed  . Hepatitis C Screening  Completed   Take Zantac 75 mg once a day for reflux

## 2017-01-05 LAB — CBC WITH DIFFERENTIAL/PLATELET
BASOS ABS: 0 10*3/uL (ref 0.0–0.2)
Basos: 0 %
EOS (ABSOLUTE): 0.2 10*3/uL (ref 0.0–0.4)
Eos: 4 %
HEMOGLOBIN: 14.8 g/dL (ref 13.0–17.7)
Hematocrit: 43.7 % (ref 37.5–51.0)
IMMATURE GRANS (ABS): 0 10*3/uL (ref 0.0–0.1)
Immature Granulocytes: 0 %
LYMPHS: 33 %
Lymphocytes Absolute: 1.7 10*3/uL (ref 0.7–3.1)
MCH: 32.7 pg (ref 26.6–33.0)
MCHC: 33.9 g/dL (ref 31.5–35.7)
MCV: 97 fL (ref 79–97)
MONOCYTES: 8 %
Monocytes Absolute: 0.4 10*3/uL (ref 0.1–0.9)
NEUTROS ABS: 2.8 10*3/uL (ref 1.4–7.0)
NEUTROS PCT: 55 %
PLATELETS: 185 10*3/uL (ref 150–379)
RBC: 4.52 x10E6/uL (ref 4.14–5.80)
RDW: 13.8 % (ref 12.3–15.4)
WBC: 5.1 10*3/uL (ref 3.4–10.8)

## 2017-01-05 LAB — COMPREHENSIVE METABOLIC PANEL
ALK PHOS: 63 IU/L (ref 39–117)
ALT: 25 IU/L (ref 0–44)
AST: 21 IU/L (ref 0–40)
Albumin/Globulin Ratio: 1.7 (ref 1.2–2.2)
Albumin: 4.4 g/dL (ref 3.5–5.5)
BILIRUBIN TOTAL: 0.6 mg/dL (ref 0.0–1.2)
BUN/Creatinine Ratio: 15 (ref 9–20)
BUN: 14 mg/dL (ref 6–24)
CHLORIDE: 102 mmol/L (ref 96–106)
CO2: 25 mmol/L (ref 18–29)
Calcium: 9.2 mg/dL (ref 8.7–10.2)
Creatinine, Ser: 0.94 mg/dL (ref 0.76–1.27)
GFR calc Af Amer: 102 mL/min/{1.73_m2} (ref >59)
GFR, EST NON AFRICAN AMERICAN: 88 mL/min/{1.73_m2} (ref >59)
GLOBULIN, TOTAL: 2.6 g/dL (ref 1.5–4.5)
Glucose: 88 mg/dL (ref 65–99)
POTASSIUM: 4.3 mmol/L (ref 3.5–5.2)
SODIUM: 142 mmol/L (ref 134–144)
Total Protein: 7 g/dL (ref 6.0–8.5)

## 2017-01-05 LAB — LIPID PANEL
CHOL/HDL RATIO: 4.3 ratio (ref 0.0–5.0)
CHOLESTEROL TOTAL: 225 mg/dL — AB (ref 100–199)
HDL: 52 mg/dL (ref >39)
LDL Calculated: 151 mg/dL — ABNORMAL HIGH (ref 0–99)
TRIGLYCERIDES: 112 mg/dL (ref 0–149)
VLDL Cholesterol Cal: 22 mg/dL (ref 5–40)

## 2017-01-05 LAB — PSA: Prostate Specific Ag, Serum: 0.4 ng/mL (ref 0.0–4.0)

## 2017-04-11 DIAGNOSIS — D485 Neoplasm of uncertain behavior of skin: Secondary | ICD-10-CM | POA: Diagnosis not present

## 2017-04-11 DIAGNOSIS — Z859 Personal history of malignant neoplasm, unspecified: Secondary | ICD-10-CM | POA: Diagnosis not present

## 2017-04-11 DIAGNOSIS — C44612 Basal cell carcinoma of skin of right upper limb, including shoulder: Secondary | ICD-10-CM | POA: Diagnosis not present

## 2017-04-11 DIAGNOSIS — Z85828 Personal history of other malignant neoplasm of skin: Secondary | ICD-10-CM | POA: Diagnosis not present

## 2017-04-11 DIAGNOSIS — L570 Actinic keratosis: Secondary | ICD-10-CM | POA: Diagnosis not present

## 2017-05-10 DIAGNOSIS — C44612 Basal cell carcinoma of skin of right upper limb, including shoulder: Secondary | ICD-10-CM | POA: Diagnosis not present

## 2017-08-27 ENCOUNTER — Other Ambulatory Visit: Payer: Self-pay | Admitting: Internal Medicine

## 2017-11-08 ENCOUNTER — Encounter: Payer: Self-pay | Admitting: Internal Medicine

## 2017-11-08 ENCOUNTER — Ambulatory Visit: Payer: BLUE CROSS/BLUE SHIELD | Admitting: Internal Medicine

## 2017-11-08 VITALS — BP 118/78 | HR 62 | Ht 72.0 in | Wt 225.0 lb

## 2017-11-08 DIAGNOSIS — M7021 Olecranon bursitis, right elbow: Secondary | ICD-10-CM | POA: Diagnosis not present

## 2017-11-08 MED ORDER — PREDNISONE 10 MG PO TABS: ORAL_TABLET | ORAL | 0 refills | Status: DC

## 2017-11-08 NOTE — Progress Notes (Signed)
Date:  11/08/2017   Name:  John Mathews   DOB:  April 08, 1956   MRN:  [id number]   Chief Complaint: Cyst (Rt elbow- noticed about 2 weeks ago. Stated not painful- not growing in size. No injury to elbow. )    Review of Systems  Constitutional: Negative for chills, fatigue and fever.  Respiratory: Negative for chest tightness and shortness of breath.   Cardiovascular: Negative for chest pain.  Musculoskeletal: Positive for joint swelling. Negative for arthralgias.    Patient Active Problem List   Diagnosis Date Noted  . Gastroesophageal reflux disease 01/04/2017  . Mild mitral regurgitation 10/20/2016  . 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 Mild intermittent asthma without complication 19/50/9326  . Abnormal LFTs 06/20/2015  . Thrombocytopenia ([place]) 06/20/2015  . Accumulation of fluid in tissues 05/21/2015  . Tubular adenoma of colon 05/21/2015  . Dermatitis of multiple sites 05/21/2015    Prior to Admission medications   Medication Sig Start Date End Date Taking? Authorizing Provider  Clobetasol Propionate (CLOBEX) 0.05 % lotion Apply 1 application topically 2 (two) times daily. 05/21/15  Yes Glean Hess, MD  SYMBICORT 160-4.5 MCG/ACT inhaler INHALE 1 PUFF BY MOUTH TWICE A DAY 08/27/17  Yes Glean Hess, MD    Allergies  Allergen Reactions  . Diphenhydramine Hcl Other (See Comments)    hyperactivity    Past Surgical History:  Procedure Laterality Date  . BASAL CELL CARCINOMA EXCISION    . COLONOSCOPY  2012   repeat in 3 years    Social History   Tobacco Use  . Smoking status: Never Smoker  . Smokeless tobacco: Never Used  Substance Use Topics  . Alcohol use: Yes    Alcohol/week: 0.0 oz    Comment: occasional  . Drug use: No     Medication list has been reviewed and updated.  PHQ 2/9 Scores 11/08/2017  PHQ - 2 Score 0    Physical Exam  Constitutional: He is oriented to person, place, and time. He appears well-developed. No distress.    HENT:  Head: Normocephalic and atraumatic.  Cardiovascular: Normal rate, regular rhythm and normal heart sounds.  Pulmonary/Chest: Effort normal and breath sounds normal. No respiratory distress. He has no wheezes.  Musculoskeletal: Normal range of motion.       Right elbow: He exhibits swelling and effusion. He exhibits normal range of motion. No tenderness found.  Neurological: He is alert and oriented to person, place, and time.  Skin: Skin is warm and dry. No rash noted.  Psychiatric: He has a normal mood and affect. His behavior is normal. Thought content normal.  Nursing note and vitals reviewed.   BP 118/78   Pulse 62   Ht 6' (1.829 m)   Wt 225 lb (102.1 kg)   SpO2 96%   BMI 30.52 kg/m   Assessment and Plan: 1. Olecranon bursitis of right elbow Avoid pressure - predniSONE (DELTASONE) 10 MG tablet; Take 6 on day 1, 5 on day 2, 4 on day 3, 3 on day 4, 2 on day 5 and 1 on day 1 then stop.  Dispense: 21 tablet; Refill: 0   Meds ordered this encounter  Medications  . predniSONE (DELTASONE) 10 MG tablet    Sig: Take 6 on day 1, 5 on day 2, 4 on day 3, 3 on day 4, 2 on day 5 and 1 on day 1 then stop.    Dispense:  21 tablet    Refill:  0  Partially dictated using Editor, commissioning. Any errors are unintentional.  Halina Maidens, MD [facility]  11/08/2017

## 2018-01-09 ENCOUNTER — Encounter: Payer: BLUE CROSS/BLUE SHIELD | Admitting: Internal Medicine

## 2018-01-18 ENCOUNTER — Ambulatory Visit (INDEPENDENT_AMBULATORY_CARE_PROVIDER_SITE_OTHER): Payer: BLUE CROSS/BLUE SHIELD | Admitting: Internal Medicine

## 2018-01-18 ENCOUNTER — Encounter: Payer: Self-pay | Admitting: Internal Medicine

## 2018-01-18 VITALS — BP 132/86 | HR 60 | Ht 72.0 in | Wt 228.0 lb

## 2018-01-18 DIAGNOSIS — D696 Thrombocytopenia, unspecified: Secondary | ICD-10-CM

## 2018-01-18 DIAGNOSIS — E782 Mixed hyperlipidemia: Secondary | ICD-10-CM

## 2018-01-18 DIAGNOSIS — J4520 Mild intermittent asthma, uncomplicated: Secondary | ICD-10-CM

## 2018-01-18 DIAGNOSIS — M722 Plantar fascial fibromatosis: Secondary | ICD-10-CM | POA: Diagnosis not present

## 2018-01-18 DIAGNOSIS — L309 Dermatitis, unspecified: Secondary | ICD-10-CM | POA: Diagnosis not present

## 2018-01-18 DIAGNOSIS — Z0000 Encounter for general adult medical examination without abnormal findings: Secondary | ICD-10-CM

## 2018-01-18 DIAGNOSIS — Z0001 Encounter for general adult medical examination with abnormal findings: Secondary | ICD-10-CM

## 2018-01-18 LAB — POCT URINALYSIS DIPSTICK
BILIRUBIN UA: NEGATIVE
GLUCOSE UA: NEGATIVE
Ketones, UA: NEGATIVE
Leukocytes, UA: NEGATIVE
Nitrite, UA: NEGATIVE
Protein, UA: NEGATIVE
RBC UA: NEGATIVE
Spec Grav, UA: 1.015 (ref 1.010–1.025)
Urobilinogen, UA: 0.2 E.U./dL
pH, UA: 6 (ref 5.0–8.0)

## 2018-01-18 MED ORDER — CLOBETASOL PROPIONATE 0.05 % EX LOTN: 1.0000 "application " | TOPICAL_LOTION | Freq: Two times a day (BID) | CUTANEOUS | 1 refills | Status: DC

## 2018-01-18 NOTE — Patient Instructions (Signed)
Take Aleve 1or 2 tabs twice a day for heel pain.  Plantar Fasciitis Plantar fasciitis is a painful foot condition that affects the heel. It occurs when the band of tissue that connects the toes to the heel bone (plantar fascia) becomes irritated. This can happen after exercising too much or doing other repetitive activities (overuse injury). The pain from plantar fasciitis can range from mild irritation to severe pain that makes it difficult for you to walk or move. The pain is usually worse in the morning or after you have been sitting or lying down for a while. What are the causes? This condition may be caused by:  Standing for long periods of time.  Wearing shoes that do not fit.  Doing high-impact activities, including running, aerobics, and ballet.  Being overweight.  Having an abnormal way of walking (gait).  Having tight calf muscles.  Having high arches in your feet.  Starting a new athletic activity.  What are the signs or symptoms? The main symptom of this condition is heel pain. Other symptoms include:  Pain that gets worse after activity or exercise.  Pain that is worse in the morning or after resting.  Pain that goes away after you walk for a few minutes.  How is this diagnosed? This condition may be diagnosed based on your signs and symptoms. Your health care provider will also do a physical exam to check for:  A tender area on the bottom of your foot.  A high arch in your foot.  Pain when you move your foot.  Difficulty moving your foot.  You may also need to have imaging studies to confirm the diagnosis. These can include:  X-rays.  Ultrasound.  MRI.  How is this treated? Treatment for plantar fasciitis depends on the severity of the condition. Your treatment may include:  Rest, ice, and over-the-counter pain medicines to manage your pain.  Exercises to stretch your calves and your plantar fascia.  A splint that holds your foot in a stretched,  upward position while you sleep (night splint).  Physical therapy to relieve symptoms and prevent problems in the future.  Cortisone injections to relieve severe pain.  Extracorporeal shock wave therapy (ESWT) to stimulate damaged plantar fascia with electrical impulses. It is often used as a last resort before surgery.  Surgery, if other treatments have not worked after 12 months.  Follow these instructions at home:  Take medicines only as directed by your health care provider.  Avoid activities that cause pain.  Roll the bottom of your foot over a bag of ice or a bottle of cold water. Do this for 20 minutes, 3-4 times a day.  Perform simple stretches as directed by your health care provider.  Try wearing athletic shoes with air-sole or gel-sole cushions or soft shoe inserts.  Wear a night splint while sleeping, if directed by your health care provider.  Keep all follow-up appointments with your health care provider. How is this prevented?  Do not perform exercises or activities that cause heel pain.  Consider finding low-impact activities if you continue to have problems.  Lose weight if you need to. The best way to prevent plantar fasciitis is to avoid the activities that aggravate your plantar fascia. Contact a health care provider if:  Your symptoms do not go away after treatment with home care measures.  Your pain gets worse.  Your pain affects your ability to move or do your daily activities. This information is not intended to replace  advice given to you by your health care provider. Make sure you discuss any questions you have with your health care provider. Document Released: 07/13/2001 Document Revised: 03/22/2016 Document Reviewed: 08/28/2014 Elsevier Interactive Patient Education  [employer].

## 2018-01-18 NOTE — Progress Notes (Signed)
Date:  01/18/2018   Name:  John Mathews   DOB:  02-06-1956   MRN:  [id number]   Chief Complaint: Annual Exam and Foot Pain (Right- started 6-9 months ago. Steady pain and not getting better. Hurts only when walking on it. Better with shoes. )  John Mathews is a 62 y.o. male who presents today for his Complete Annual Exam. He feels well. 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 He reports he is sleeping well.   Asthma  There is no shortness of breath or wheezing. This is a chronic problem. The problem occurs rarely. The problem has been resolved. Pertinent negatives include no appetite change, chest pain, headaches, myalgias or trouble swallowing. His past medical history is significant for asthma.  Foot Injury   There was no injury mechanism. The quality of the pain is described as shooting. The pain is at a severity of 2/10. The pain has been fluctuating since onset. He reports no foreign bodies present. He has tried nothing for the symptoms.  Hyperlipidemia  This is a chronic problem. Recent lipid tests were reviewed and are variable. Pertinent negatives include no chest pain, myalgias or shortness of breath. Current antihyperlipidemic treatment includes diet change. The current treatment provides mild improvement of lipids.  Thrombocytopenia - has been mild and chronic.  Pt denies bleeding or bruising. Dermatitis - uses steroid cream on feet and ankles as needed.  Sees Derm once a year.  Has a new chest wall lesion - open and not healing over the past week. Review of Systems  Constitutional: Negative for appetite change, chills, diaphoresis, fatigue and unexpected weight change.  HENT: Negative for hearing loss, tinnitus, trouble swallowing and voice change.   Eyes: Negative for visual disturbance.  Respiratory: Negative for choking, shortness of breath and wheezing.   Cardiovascular: Negative for chest pain, palpitations and leg swelling.    Gastrointestinal: Negative for abdominal pain, blood in stool, constipation and diarrhea.  Genitourinary: Negative for difficulty urinating, dysuria and frequency.  Musculoskeletal: Positive for arthralgias (heel pain). Negative for back pain and myalgias.  Skin: Negative for color change and rash. Wound: small open area on chest - no injury.  Neurological: Negative for dizziness, syncope and headaches.  Hematological: Negative for adenopathy. Does not bruise/bleed easily.  Psychiatric/Behavioral: Negative for dysphoric mood and sleep disturbance.    Patient Active Problem List   Diagnosis Date Noted  . Gastroesophageal reflux disease 01/04/2017  . Mild mitral regurgitation 10/20/2016  . 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 Mild intermittent asthma without complication 74/25/9563  . Abnormal LFTs 06/20/2015  . Thrombocytopenia ([place]) 06/20/2015  . Tubular adenoma of colon 05/21/2015  . Dermatitis of multiple sites 05/21/2015    Prior to Admission medications   Medication Sig Start Date End Date Taking? Authorizing Provider  Clobetasol Propionate (CLOBEX) 0.05 % lotion Apply 1 application topically 2 (two) times daily. 05/21/15  Yes Glean Hess, MD  SYMBICORT 160-4.5 MCG/ACT inhaler INHALE 1 PUFF BY MOUTH TWICE A DAY 08/27/17  Yes Glean Hess, MD    Allergies  Allergen Reactions  . Diphenhydramine Hcl Other (See Comments)    hyperactivity    Past Surgical History:  Procedure Laterality Date  . BASAL CELL CARCINOMA EXCISION    . COLONOSCOPY  2012   repeat in 3 years    Social History   Tobacco Use  . Smoking status: Never Smoker  . Smokeless tobacco: Never Used  Substance Use Topics  . Alcohol use: Yes    Alcohol/week: 0.0 oz    Comment: occasional  . Drug use: No     Medication list has been reviewed and updated.  PHQ 2/9 Scores 01/18/2018 11/08/2017  PHQ - 2 Score 0 0    Physical Exam  Constitutional: He is oriented to person, place, and time. He  appears well-developed and well-nourished.  HENT:  Head: Normocephalic.  Right Ear: Tympanic membrane, external ear and ear canal normal.  Left Ear: Tympanic membrane, external ear and ear canal normal.  Nose: Nose normal.  Mouth/Throat: Uvula is midline and oropharynx is clear and moist.  Eyes: Conjunctivae and EOM are normal. Pupils are equal, round, and reactive to light.  Neck: Normal range of motion. Neck supple. Carotid bruit is not present. No thyromegaly present.  Cardiovascular: Normal rate, regular rhythm, normal heart sounds and intact distal pulses.  Pulmonary/Chest: Effort normal and breath sounds normal. He has no wheezes. Right breast exhibits no mass. Left breast exhibits no mass.  Abdominal: Soft. Normal appearance and bowel sounds are normal. There is no hepatosplenomegaly. There is no tenderness.  Musculoskeletal: Normal range of motion. He exhibits tenderness (right heel plantar aspect).  Lymphadenopathy:    He has no cervical adenopathy.  Neurological: He is alert and oriented to person, place, and time. He has normal reflexes.  Skin: Skin is warm, dry and intact. Lesion (mid upper chest) noted.  Psychiatric: He has a normal mood and affect. His speech is normal and behavior is normal. Judgment and thought content normal.  Nursing note and vitals reviewed.   BP 132/86   Pulse 60   Ht 6' (1.829 m)   Wt 228 lb (103.4 kg)   SpO2 98%   BMI 30.92 kg/m   Assessment and Plan: 1. Annual physical exam Normal exam except for weight Recommend reduced fat diet - POCT urinalysis dipstick  2. Mild intermittent asthma without complication Doing well on symbicort  3. Thrombocytopenia (HCC) - CBC with Differential/Platelet  4. Mixed hyperlipidemia - Comprehensive metabolic panel - Lipid panel  5. Dermatitis of multiple sites See Dermatology if chest lesion is not healing - Clobetasol Propionate (CLOBEX) 0.05 % lotion; Apply 1 application topically 2 (two) times  daily.  Dispense: 59 mL; Refill: 1 - Comprehensive metabolic panel  6. Plantar fasciitis of right foot Begin Aleve bid and ice bottle massage   Meds ordered this encounter  Medications  . Clobetasol Propionate (CLOBEX) 0.05 % lotion    Sig: Apply 1 application topically 2 (two) times daily.    Dispense:  59 mL    Refill:  1    Partially dictated using Editor, commissioning. Any errors are unintentional.  Halina Maidens, MD [facility]  01/18/2018

## 2018-01-19 LAB — COMPREHENSIVE METABOLIC PANEL
ALBUMIN: 4.7 g/dL (ref 3.6–4.8)
ALK PHOS: 68 IU/L (ref 39–117)
ALT: 24 IU/L (ref 0–44)
AST: 22 IU/L (ref 0–40)
Albumin/Globulin Ratio: 2 (ref 1.2–2.2)
BUN/Creatinine Ratio: 11 (ref 10–24)
BUN: 10 mg/dL (ref 8–27)
Bilirubin Total: 0.5 mg/dL (ref 0.0–1.2)
CALCIUM: 9 mg/dL (ref 8.6–10.2)
CO2: 21 mmol/L (ref 20–29)
CREATININE: 0.94 mg/dL (ref 0.76–1.27)
Chloride: 105 mmol/L (ref 96–106)
GFR calc Af Amer: 101 mL/min/{1.73_m2} (ref >59)
GFR, EST NON AFRICAN AMERICAN: 88 mL/min/{1.73_m2} (ref >59)
GLUCOSE: 105 mg/dL — AB (ref 65–99)
Globulin, Total: 2.4 g/dL (ref 1.5–4.5)
Potassium: 4.3 mmol/L (ref 3.5–5.2)
Sodium: 144 mmol/L (ref 134–144)
Total Protein: 7.1 g/dL (ref 6.0–8.5)

## 2018-01-19 LAB — CBC WITH DIFFERENTIAL/PLATELET
BASOS ABS: 0 10*3/uL (ref 0.0–0.2)
Basos: 0 %
EOS (ABSOLUTE): 0.2 10*3/uL (ref 0.0–0.4)
EOS: 4 %
HEMOGLOBIN: 15.5 g/dL (ref 13.0–17.7)
Hematocrit: 44.9 % (ref 37.5–51.0)
IMMATURE GRANULOCYTES: 1 %
Immature Grans (Abs): 0 10*3/uL (ref 0.0–0.1)
LYMPHS ABS: 1.1 10*3/uL (ref 0.7–3.1)
Lymphs: 28 %
MCH: 33.3 pg — ABNORMAL HIGH (ref 26.6–33.0)
MCHC: 34.5 g/dL (ref 31.5–35.7)
MCV: 97 fL (ref 79–97)
MONOCYTES: 8 %
Monocytes Absolute: 0.3 10*3/uL (ref 0.1–0.9)
NEUTROS PCT: 59 %
Neutrophils Absolute: 2.3 10*3/uL (ref 1.4–7.0)
Platelets: 193 10*3/uL (ref 150–379)
RBC: 4.65 x10E6/uL (ref 4.14–5.80)
RDW: 13.6 % (ref 12.3–15.4)
WBC: 3.9 10*3/uL (ref 3.4–10.8)

## 2018-01-19 LAB — LIPID PANEL
CHOLESTEROL TOTAL: 240 mg/dL — AB (ref 100–199)
Chol/HDL Ratio: 5 ratio (ref 0.0–5.0)
HDL: 48 mg/dL (ref >39)
LDL CALC: 173 mg/dL — AB (ref 0–99)
TRIGLYCERIDES: 95 mg/dL (ref 0–149)
VLDL CHOLESTEROL CAL: 19 mg/dL (ref 5–40)

## 2018-01-20 ENCOUNTER — Other Ambulatory Visit: Payer: Self-pay | Admitting: Internal Medicine

## 2018-01-20 DIAGNOSIS — E782 Mixed hyperlipidemia: Secondary | ICD-10-CM

## 2018-01-20 MED ORDER — ATORVASTATIN CALCIUM 10 MG PO TABS: 10.0000 mg | ORAL_TABLET | Freq: Every day | ORAL | 1 refills | Status: DC

## 2018-01-20 NOTE — Progress Notes (Signed)
Please call pt and have him scheduled for office visit and labs in 6 months since he is beginning new medication for cholesterol.. Thanks.

## 2018-01-20 NOTE — Progress Notes (Unsigned)
a 

## 2018-01-20 NOTE — Progress Notes (Signed)
Patient agrees to start cholesterol medication- wants sent to [facility]. Please Advise.

## 2018-02-07 ENCOUNTER — Other Ambulatory Visit: Payer: Self-pay | Admitting: Internal Medicine

## 2018-04-17 DIAGNOSIS — Z859 Personal history of malignant neoplasm, unspecified: Secondary | ICD-10-CM | POA: Diagnosis not present

## 2018-04-17 DIAGNOSIS — C44519 Basal cell carcinoma of skin of other part of trunk: Secondary | ICD-10-CM | POA: Diagnosis not present

## 2018-04-17 DIAGNOSIS — Z1283 Encounter for screening for malignant neoplasm of skin: Secondary | ICD-10-CM | POA: Diagnosis not present

## 2018-04-17 DIAGNOSIS — D485 Neoplasm of uncertain behavior of skin: Secondary | ICD-10-CM | POA: Diagnosis not present

## 2018-04-17 DIAGNOSIS — L570 Actinic keratosis: Secondary | ICD-10-CM | POA: Diagnosis not present

## 2018-04-17 DIAGNOSIS — L578 Other skin changes due to chronic exposure to nonionizing radiation: Secondary | ICD-10-CM | POA: Diagnosis not present

## 2018-06-07 DIAGNOSIS — C4491 Basal cell carcinoma of skin, unspecified: Secondary | ICD-10-CM | POA: Diagnosis not present

## 2018-07-16 ENCOUNTER — Other Ambulatory Visit: Payer: Self-pay | Admitting: Internal Medicine

## 2018-07-16 DIAGNOSIS — E782 Mixed hyperlipidemia: Secondary | ICD-10-CM

## 2018-07-24 ENCOUNTER — Ambulatory Visit: Payer: BLUE CROSS/BLUE SHIELD | Admitting: Internal Medicine

## 2018-07-24 ENCOUNTER — Encounter: Payer: Self-pay | Admitting: Internal Medicine

## 2018-07-24 VITALS — BP 112/72 | HR 60 | Ht 72.0 in | Wt 226.4 lb

## 2018-07-24 DIAGNOSIS — E782 Mixed hyperlipidemia: Secondary | ICD-10-CM | POA: Diagnosis not present

## 2018-07-24 DIAGNOSIS — J4520 Mild intermittent asthma, uncomplicated: Secondary | ICD-10-CM

## 2018-07-24 DIAGNOSIS — Z23 Encounter for immunization: Secondary | ICD-10-CM

## 2018-07-24 NOTE — Progress Notes (Signed)
Date:  07/24/2018   Name:  John Mathews   DOB:  [birth year]   MRN:  [id number]   Chief Complaint: Hyperlipidemia and Immunizations (Reg dose flu. )  Hyperlipidemia  This is a chronic problem. Pertinent negatives include no chest pain, leg pain, myalgias or shortness of breath. Current antihyperlipidemic treatment includes statins (started last visit). There are no compliance problems.   Asthma  He complains of wheezing. There is no shortness of breath. This is a recurrent problem. The problem occurs rarely. Pertinent negatives include no chest pain, headaches or myalgias. His symptoms are alleviated by steroid inhaler and beta-agonist. He reports significant improvement on treatment. His past medical history is significant for asthma.   Lab Results  Component Value Date   CHOL 240 (H) 01/18/2018   HDL 48 01/18/2018   LDLCALC 173 (H) 01/18/2018   TRIG 95 01/18/2018   CHOLHDL 5.0 01/18/2018     Review of Systems  Constitutional: Negative for diaphoresis and unexpected weight change.  Respiratory: Positive for wheezing. Negative for chest tightness and shortness of breath.   Cardiovascular: Negative for chest pain and palpitations.  Gastrointestinal: Negative for abdominal pain, constipation and diarrhea.  Musculoskeletal: Negative for arthralgias and myalgias.  Neurological: Negative for dizziness and headaches.  Psychiatric/Behavioral: Negative for sleep disturbance.    Patient Active Problem List   Diagnosis Date Noted  . Plantar fasciitis of right foot 01/18/2018  . Gastroesophageal reflux disease 01/04/2017  . Mild mitral regurgitation 10/20/2016  . 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 Mild intermittent asthma without complication 14/97/0263  . Abnormal LFTs 06/20/2015  . Thrombocytopenia ([place]) 06/20/2015  . Tubular adenoma of colon 05/21/2015  . Dermatitis of multiple sites 05/21/2015    Allergies  Allergen Reactions  . Diphenhydramine Hcl Other (See  Comments)    hyperactivity    Past Surgical History:  Procedure Laterality Date  . BASAL CELL CARCINOMA EXCISION    . COLONOSCOPY  2012   repeat in 3 years    Social History   Tobacco Use  . Smoking status: Never Smoker  . Smokeless tobacco: Never Used  Substance Use Topics  . Alcohol use: Yes    Alcohol/week: 0.0 standard drinks    Comment: occasional  . Drug use: No     Medication list has been reviewed and updated.  Current Meds  Medication Sig  . atorvastatin (LIPITOR) 10 MG tablet TAKE 1 TABLET BY MOUTH EVERY DAY  . Clobetasol Propionate (CLOBEX) 0.05 % lotion Apply 1 application topically 2 (two) times daily.  . SYMBICORT 160-4.5 MCG/ACT inhaler INHALE 1 PUFF BY MOUTH TWICE A DAY    PHQ 2/9 Scores 01/18/2018 11/08/2017  PHQ - 2 Score 0 0    Physical Exam  Constitutional: He is oriented to person, place, and time. He appears well-developed. No distress.  HENT:  Head: Normocephalic and atraumatic.  Neck: Normal range of motion. Neck supple. Carotid bruit is not present.  Cardiovascular: Normal rate, regular rhythm and normal heart sounds.  Pulmonary/Chest: Effort normal and breath sounds normal. No respiratory distress. He has no wheezes.  Musculoskeletal: Normal range of motion. He exhibits no edema.  Neurological: He is alert and oriented to person, place, and time.  Skin: Skin is warm and dry. No rash noted.  Psychiatric: He has a normal mood and affect. His behavior is normal. Thought content normal.  Nursing note and vitals reviewed.   BP 112/72 (BP Location: Right Arm, Patient Position: Sitting, Cuff Size: Large)  Pulse 60   Ht 6' (1.829 m)   Wt 226 lb 6.4 oz (102.7 kg)   SpO2 97%   BMI 30.71 kg/m   Assessment and Plan: 1. Mixed hyperlipidemia Continue atorvastatin, check labs - Lipid panel - Comprehensive metabolic panel  2. Mild intermittent asthma without complication Controlled with daily symbicort  3. Need for influenza vaccination -  Flu Vaccine QUAD 6+ mos PF IM (Fluarix Quad PF)   Partially dictated using Editor, commissioning. Any errors are unintentional.  Halina Maidens, MD [facility]  07/24/2018

## 2018-07-25 LAB — COMPREHENSIVE METABOLIC PANEL
ALBUMIN: 4.3 g/dL (ref 3.6–4.8)
ALK PHOS: 66 IU/L (ref 39–117)
ALT: 28 IU/L (ref 0–44)
AST: 19 IU/L (ref 0–40)
Albumin/Globulin Ratio: 1.7 (ref 1.2–2.2)
BILIRUBIN TOTAL: 0.6 mg/dL (ref 0.0–1.2)
BUN / CREAT RATIO: 13 (ref 10–24)
BUN: 13 mg/dL (ref 8–27)
CO2: 24 mmol/L (ref 20–29)
Calcium: 9.5 mg/dL (ref 8.6–10.2)
Chloride: 103 mmol/L (ref 96–106)
Creatinine, Ser: 1.01 mg/dL (ref 0.76–1.27)
GFR calc Af Amer: 92 mL/min/{1.73_m2} (ref >59)
GFR calc non Af Amer: 80 mL/min/{1.73_m2} (ref >59)
GLUCOSE: 83 mg/dL (ref 65–99)
Globulin, Total: 2.6 g/dL (ref 1.5–4.5)
POTASSIUM: 4.3 mmol/L (ref 3.5–5.2)
SODIUM: 144 mmol/L (ref 134–144)
Total Protein: 6.9 g/dL (ref 6.0–8.5)

## 2018-07-25 LAB — LIPID PANEL
Chol/HDL Ratio: 3.4 ratio (ref 0.0–5.0)
Cholesterol, Total: 165 mg/dL (ref 100–199)
HDL: 49 mg/dL (ref >39)
LDL Calculated: 93 mg/dL (ref 0–99)
Triglycerides: 114 mg/dL (ref 0–149)
VLDL Cholesterol Cal: 23 mg/dL (ref 5–40)

## 2018-11-21 DIAGNOSIS — L578 Other skin changes due to chronic exposure to nonionizing radiation: Secondary | ICD-10-CM | POA: Diagnosis not present

## 2018-11-21 DIAGNOSIS — Z859 Personal history of malignant neoplasm, unspecified: Secondary | ICD-10-CM | POA: Diagnosis not present

## 2018-11-21 DIAGNOSIS — L570 Actinic keratosis: Secondary | ICD-10-CM | POA: Diagnosis not present

## 2018-11-21 DIAGNOSIS — Z1283 Encounter for screening for malignant neoplasm of skin: Secondary | ICD-10-CM | POA: Diagnosis not present

## 2018-11-21 DIAGNOSIS — D485 Neoplasm of uncertain behavior of skin: Secondary | ICD-10-CM | POA: Diagnosis not present

## 2018-12-11 DIAGNOSIS — C4491 Basal cell carcinoma of skin, unspecified: Secondary | ICD-10-CM | POA: Diagnosis not present

## 2019-01-18 ENCOUNTER — Other Ambulatory Visit: Payer: Self-pay | Admitting: Internal Medicine

## 2019-01-18 DIAGNOSIS — E782 Mixed hyperlipidemia: Secondary | ICD-10-CM

## 2019-01-19 ENCOUNTER — Encounter: Payer: Self-pay | Admitting: Internal Medicine

## 2019-01-19 ENCOUNTER — Ambulatory Visit (INDEPENDENT_AMBULATORY_CARE_PROVIDER_SITE_OTHER): Payer: BLUE CROSS/BLUE SHIELD | Admitting: Internal Medicine

## 2019-01-19 ENCOUNTER — Other Ambulatory Visit: Payer: Self-pay

## 2019-01-19 VITALS — BP 128/84 | HR 67 | Ht 72.0 in | Wt 227.0 lb

## 2019-01-19 DIAGNOSIS — Z0000 Encounter for general adult medical examination without abnormal findings: Secondary | ICD-10-CM

## 2019-01-19 DIAGNOSIS — Z125 Encounter for screening for malignant neoplasm of prostate: Secondary | ICD-10-CM | POA: Diagnosis not present

## 2019-01-19 DIAGNOSIS — E782 Mixed hyperlipidemia: Secondary | ICD-10-CM | POA: Diagnosis not present

## 2019-01-19 DIAGNOSIS — Z23 Encounter for immunization: Secondary | ICD-10-CM

## 2019-01-19 DIAGNOSIS — J4520 Mild intermittent asthma, uncomplicated: Secondary | ICD-10-CM | POA: Diagnosis not present

## 2019-01-19 DIAGNOSIS — D696 Thrombocytopenia, unspecified: Secondary | ICD-10-CM | POA: Diagnosis not present

## 2019-01-19 LAB — POCT URINALYSIS DIPSTICK
Bilirubin, UA: NEGATIVE
Glucose, UA: NEGATIVE
KETONES UA: NEGATIVE
Leukocytes, UA: NEGATIVE
NITRITE UA: NEGATIVE
PROTEIN UA: NEGATIVE
RBC UA: NEGATIVE
SPEC GRAV UA: 1.015 (ref 1.010–1.025)
UROBILINOGEN UA: 0.2 U/dL
pH, UA: 6.5 (ref 5.0–8.0)

## 2019-01-19 MED ORDER — BUDESONIDE-FORMOTEROL FUMARATE 160-4.5 MCG/ACT IN AERO: 1.0000 | INHALATION_SPRAY | Freq: Every day | RESPIRATORY_TRACT | 12 refills | Status: DC

## 2019-01-19 MED ORDER — ATORVASTATIN CALCIUM 10 MG PO TABS: 10.0000 mg | ORAL_TABLET | Freq: Every day | ORAL | 3 refills | Status: DC

## 2019-01-19 NOTE — Progress Notes (Signed)
Date:  01/19/2019   Name:  John Mathews   DOB:  03/26/1956   MRN:  [id number]   Chief Complaint: No chief complaint on file. John Mathews is a 63 y.o. male who presents today for his Complete Annual Exam. He feels well. He reports exercising none - has a physical job. He reports he is sleeping well.  Last colonoscopy 2016 Last Tdap 04/2011 PPV-23 2012  Hyperlipidemia  This is a chronic problem. The problem is controlled. Pertinent negatives include no chest pain, leg pain, myalgias or shortness of breath. Current antihyperlipidemic treatment includes statins. The current treatment provides significant improvement of lipids.  Asthma  There is no shortness of breath or wheezing. This is a recurrent problem. The problem occurs rarely. Pertinent negatives include no appetite change, chest pain, headaches, myalgias or trouble swallowing. His past medical history is significant for asthma.    Review of Systems  Constitutional: Negative for appetite change, chills, diaphoresis, fatigue and unexpected weight change.  HENT: Negative for hearing loss, tinnitus, trouble swallowing and voice change.   Eyes: Negative for visual disturbance.  Respiratory: Negative for choking, shortness of breath and wheezing.   Cardiovascular: Negative for chest pain, palpitations and leg swelling.  Gastrointestinal: Negative for abdominal pain, blood in stool, constipation and diarrhea.  Genitourinary: Negative for difficulty urinating, dysuria and frequency.  Musculoskeletal: Negative for arthralgias, back pain and myalgias.  Skin: Positive for color change and rash.  Allergic/Immunologic: Negative for environmental allergies.  Neurological: Negative for dizziness, syncope and headaches.  Hematological: Negative for adenopathy.  Psychiatric/Behavioral: Negative for dysphoric mood and sleep disturbance.    Patient Active Problem List   Diagnosis Date Noted  . Plantar fasciitis of right foot  01/18/2018  . Gastroesophageal reflux disease 01/04/2017  . Mild mitral regurgitation 10/20/2016  . 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 Mild intermittent asthma without complication 23/76/2831  . Abnormal LFTs 06/20/2015  . Thrombocytopenia ([place]) 06/20/2015  . Tubular adenoma of colon 05/21/2015  . Dermatitis of multiple sites 05/21/2015    Allergies  Allergen Reactions  . Diphenhydramine Hcl Other (See Comments)    hyperactivity    Past Surgical History:  Procedure Laterality Date  . BASAL CELL CARCINOMA EXCISION    . COLONOSCOPY  2012   repeat in 3 years    Social History   Tobacco Use  . Smoking status: Never Smoker  . Smokeless tobacco: Never Used  Substance Use Topics  . Alcohol use: Yes    Alcohol/week: 0.0 standard drinks    Comment: occasional  . Drug use: No     Medication list has been reviewed and updated.  Current Meds  Medication Sig  . atorvastatin (LIPITOR) 10 MG tablet TAKE 1 TABLET BY MOUTH EVERY DAY  . fluorouracil (EFUDEX) 5 % cream Apply topically 2 (two) times daily.  . SYMBICORT 160-4.5 MCG/ACT inhaler INHALE 1 PUFF BY MOUTH TWICE A DAY    PHQ 2/9 Scores 01/19/2019 01/18/2018 11/08/2017  PHQ - 2 Score 0 0 0    Physical Exam Vitals signs and nursing note reviewed.  Constitutional:      Appearance: Normal appearance. He is well-developed.  HENT:     Head: Normocephalic.     Right Ear: Tympanic membrane, ear canal and external ear normal.     Left Ear: Tympanic membrane, ear canal and external ear normal.     Nose: Nose normal.     Mouth/Throat:     Pharynx: Uvula midline.  Eyes:  Conjunctiva/sclera: Conjunctivae normal.     Pupils: Pupils are equal, round, and reactive to light.  Neck:     Musculoskeletal: Normal range of motion and neck supple.     Thyroid: No thyromegaly.     Vascular: No carotid bruit.  Cardiovascular:     Rate and Rhythm: Normal rate and regular rhythm.     Heart sounds: Normal heart sounds.  Pulmonary:      Effort: Pulmonary effort is normal.     Breath sounds: Normal breath sounds. No wheezing.  Chest:     Breasts:        Right: No mass.        Left: No mass.  Abdominal:     General: Bowel sounds are normal.     Palpations: Abdomen is soft.     Tenderness: There is no abdominal tenderness.  Musculoskeletal: Normal range of motion.  Lymphadenopathy:     Cervical: No cervical adenopathy.  Skin:    General: Skin is warm and dry.     Findings: Erythema (scattered red scaly lesions over face 2/2 Efudex) present.  Neurological:     Mental Status: He is alert and oriented to person, place, and time.     Deep Tendon Reflexes: Reflexes are normal and symmetric.  Psychiatric:        Speech: Speech normal.        Behavior: Behavior normal.        Thought Content: Thought content normal.        Judgment: Judgment normal.     Wt Readings from Last 3 Encounters:  01/19/19 227 lb (103 kg)  07/24/18 226 lb 6.4 oz (102.7 kg)  01/18/18 228 lb (103.4 kg)    BP 128/84   Pulse 67   Ht 6' (1.829 m)   Wt 227 lb (103 kg)   SpO2 97%   BMI 30.79 kg/m   Assessment and Plan: 1. Annual physical exam Normal exam except for weight - work on diet and exercise Being treated for pre-cancerous skin changes over face - POCT urinalysis dipstick  2. Prostate cancer screening DRE deferred - PSA  3. Mild intermittent asthma without complication Doing well on daily symbicort - budesonide-formoterol (SYMBICORT) 160-4.5 MCG/ACT inhaler; Inhale 1 puff into the lungs daily.  Dispense: 10.2 Inhaler; Refill: 12  4. Mixed hyperlipidemia Continue statin therapy - Comprehensive metabolic panel - Lipid panel - atorvastatin (LIPITOR) 10 MG tablet; Take 1 tablet (10 mg total) by mouth daily.  Dispense: 90 tablet; Refill: 3  5. Thrombocytopenia (HCC) Check labs, have been stable - CBC with Differential/Platelet  6. Need for shingles vaccine - Varicella-zoster vaccine IM   Partially dictated using Radio producer. Any errors are unintentional.  Halina Maidens, MD [facility]  01/19/2019

## 2019-01-20 LAB — CBC WITH DIFFERENTIAL/PLATELET
BASOS ABS: 0 10*3/uL (ref 0.0–0.2)
BASOS: 1 %
EOS (ABSOLUTE): 0.2 10*3/uL (ref 0.0–0.4)
Eos: 4 %
HEMOGLOBIN: 15.4 g/dL (ref 13.0–17.7)
Hematocrit: 45.4 % (ref 37.5–51.0)
IMMATURE GRANS (ABS): 0 10*3/uL (ref 0.0–0.1)
IMMATURE GRANULOCYTES: 1 %
LYMPHS: 23 %
Lymphocytes Absolute: 1.3 10*3/uL (ref 0.7–3.1)
MCH: 33.3 pg — AB (ref 26.6–33.0)
MCHC: 33.9 g/dL (ref 31.5–35.7)
MCV: 98 fL — ABNORMAL HIGH (ref 79–97)
MONOCYTES: 7 %
Monocytes Absolute: 0.4 10*3/uL (ref 0.1–0.9)
NEUTROS PCT: 64 %
Neutrophils Absolute: 3.7 10*3/uL (ref 1.4–7.0)
Platelets: 184 10*3/uL (ref 150–450)
RBC: 4.63 x10E6/uL (ref 4.14–5.80)
RDW: 12.9 % (ref 11.6–15.4)
WBC: 5.7 10*3/uL (ref 3.4–10.8)

## 2019-01-20 LAB — COMPREHENSIVE METABOLIC PANEL
ALBUMIN: 4.5 g/dL (ref 3.8–4.8)
ALT: 22 IU/L (ref 0–44)
AST: 17 IU/L (ref 0–40)
Albumin/Globulin Ratio: 1.7 (ref 1.2–2.2)
Alkaline Phosphatase: 65 IU/L (ref 39–117)
BUN / CREAT RATIO: 13 (ref 10–24)
BUN: 14 mg/dL (ref 8–27)
Bilirubin Total: 0.6 mg/dL (ref 0.0–1.2)
CALCIUM: 9.3 mg/dL (ref 8.6–10.2)
CO2: 25 mmol/L (ref 20–29)
Chloride: 101 mmol/L (ref 96–106)
Creatinine, Ser: 1.05 mg/dL (ref 0.76–1.27)
GFR calc Af Amer: 88 mL/min/{1.73_m2} (ref >59)
GFR, EST NON AFRICAN AMERICAN: 76 mL/min/{1.73_m2} (ref >59)
GLUCOSE: 99 mg/dL (ref 65–99)
Globulin, Total: 2.7 g/dL (ref 1.5–4.5)
Potassium: 4.5 mmol/L (ref 3.5–5.2)
Sodium: 143 mmol/L (ref 134–144)
TOTAL PROTEIN: 7.2 g/dL (ref 6.0–8.5)

## 2019-01-20 LAB — LIPID PANEL
CHOL/HDL RATIO: 3.9 ratio (ref 0.0–5.0)
Cholesterol, Total: 191 mg/dL (ref 100–199)
HDL: 49 mg/dL (ref >39)
LDL CALC: 114 mg/dL — AB (ref 0–99)
Triglycerides: 139 mg/dL (ref 0–149)
VLDL CHOLESTEROL CAL: 28 mg/dL (ref 5–40)

## 2019-01-20 LAB — PSA: PROSTATE SPECIFIC AG, SERUM: 0.4 ng/mL (ref 0.0–4.0)

## 2019-04-23 ENCOUNTER — Ambulatory Visit (INDEPENDENT_AMBULATORY_CARE_PROVIDER_SITE_OTHER): Payer: BC Managed Care – PPO

## 2019-04-23 ENCOUNTER — Other Ambulatory Visit: Payer: Self-pay

## 2019-04-23 DIAGNOSIS — Z23 Encounter for immunization: Secondary | ICD-10-CM | POA: Diagnosis not present

## 2019-05-17 DIAGNOSIS — Z872 Personal history of diseases of the skin and subcutaneous tissue: Secondary | ICD-10-CM | POA: Diagnosis not present

## 2019-05-17 DIAGNOSIS — L570 Actinic keratosis: Secondary | ICD-10-CM | POA: Diagnosis not present

## 2019-05-17 DIAGNOSIS — Z859 Personal history of malignant neoplasm, unspecified: Secondary | ICD-10-CM | POA: Diagnosis not present

## 2019-05-17 DIAGNOSIS — Z85828 Personal history of other malignant neoplasm of skin: Secondary | ICD-10-CM | POA: Diagnosis not present

## 2019-05-17 DIAGNOSIS — L578 Other skin changes due to chronic exposure to nonionizing radiation: Secondary | ICD-10-CM | POA: Diagnosis not present

## 2019-06-29 ENCOUNTER — Other Ambulatory Visit: Payer: Self-pay | Admitting: Internal Medicine

## 2019-06-29 DIAGNOSIS — J4520 Mild intermittent asthma, uncomplicated: Secondary | ICD-10-CM

## 2019-08-17 ENCOUNTER — Other Ambulatory Visit: Payer: Self-pay

## 2019-08-17 ENCOUNTER — Ambulatory Visit (INDEPENDENT_AMBULATORY_CARE_PROVIDER_SITE_OTHER): Payer: BC Managed Care – PPO

## 2019-08-17 DIAGNOSIS — Z23 Encounter for immunization: Secondary | ICD-10-CM

## 2019-11-26 DIAGNOSIS — L570 Actinic keratosis: Secondary | ICD-10-CM | POA: Diagnosis not present

## 2019-11-26 DIAGNOSIS — Z859 Personal history of malignant neoplasm, unspecified: Secondary | ICD-10-CM | POA: Diagnosis not present

## 2019-11-26 DIAGNOSIS — L578 Other skin changes due to chronic exposure to nonionizing radiation: Secondary | ICD-10-CM | POA: Diagnosis not present

## 2019-11-26 DIAGNOSIS — Z85828 Personal history of other malignant neoplasm of skin: Secondary | ICD-10-CM | POA: Diagnosis not present

## 2019-11-26 DIAGNOSIS — Z872 Personal history of diseases of the skin and subcutaneous tissue: Secondary | ICD-10-CM | POA: Diagnosis not present

## 2020-01-21 ENCOUNTER — Other Ambulatory Visit: Payer: Self-pay

## 2020-01-21 ENCOUNTER — Encounter: Payer: Self-pay | Admitting: Internal Medicine

## 2020-01-21 ENCOUNTER — Ambulatory Visit (INDEPENDENT_AMBULATORY_CARE_PROVIDER_SITE_OTHER): Payer: BC Managed Care – PPO | Admitting: Internal Medicine

## 2020-01-21 VITALS — BP 124/82 | HR 74 | Temp 96.8°F | Ht 72.0 in | Wt 231.0 lb

## 2020-01-21 DIAGNOSIS — Z125 Encounter for screening for malignant neoplasm of prostate: Secondary | ICD-10-CM | POA: Diagnosis not present

## 2020-01-21 DIAGNOSIS — J4520 Mild intermittent asthma, uncomplicated: Secondary | ICD-10-CM | POA: Diagnosis not present

## 2020-01-21 DIAGNOSIS — Z0000 Encounter for general adult medical examination without abnormal findings: Secondary | ICD-10-CM

## 2020-01-21 DIAGNOSIS — E782 Mixed hyperlipidemia: Secondary | ICD-10-CM

## 2020-01-21 LAB — POCT URINALYSIS DIPSTICK
Bilirubin, UA: NEGATIVE
Blood, UA: NEGATIVE
Glucose, UA: NEGATIVE
Ketones, UA: NEGATIVE
Leukocytes, UA: NEGATIVE
Nitrite, UA: NEGATIVE
Protein, UA: NEGATIVE
Spec Grav, UA: 1.01 (ref 1.010–1.025)
Urobilinogen, UA: 0.2 E.U./dL
pH, UA: 6 (ref 5.0–8.0)

## 2020-01-21 MED ORDER — ATORVASTATIN CALCIUM 10 MG PO TABS: 10.0000 mg | ORAL_TABLET | Freq: Every day | ORAL | 3 refills | Status: DC

## 2020-01-21 NOTE — Patient Instructions (Signed)
Covid Vaccine locations: [employer] Dept. [place]  [email or web address]

## 2020-01-21 NOTE — Progress Notes (Addendum)
Date:  01/21/2020   Name:  John Mathews   DOB:  1956/10/06   MRN:  [id number]   Chief Complaint: Annual Exam John Mathews is a 64 y.o. male who presents today for his Complete Annual Exam. He feels well. He reports exercising rarely but has a physical job. He reports he is sleeping fairly well.   Colonoscopy 09/2015 repeat 2026 Immunization History  Administered Date(s) Administered  . Influenza,inj,Quad PF,6+ Mos 07/24/2018, 08/17/2019  . Influenza-Unspecified 07/19/2015, 08/27/2017  . Pneumococcal Polysaccharide-23 01/12/2011  . Tdap 04/20/2011  . Zoster 01/24/2014  . Zoster Recombinat (Shingrix) 01/19/2019, 04/23/2019    Asthma There is no chest tightness, cough, shortness of breath or wheezing. This is a recurrent problem. The problem has been unchanged. Associated symptoms include sneezing. Pertinent negatives include no appetite change, chest pain, headaches, myalgias or trouble swallowing. His symptoms are aggravated by pollen. His past medical history is significant for asthma.  Hyperlipidemia The problem is controlled. Pertinent negatives include no chest pain, myalgias or shortness of breath. Current antihyperlipidemic treatment includes statins. The current treatment provides significant improvement of lipids.    Lab Results  Component Value Date   CREATININE 1.05 01/19/2019   BUN 14 01/19/2019   NA 143 01/19/2019   K 4.5 01/19/2019   CL 101 01/19/2019   CO2 25 01/19/2019   Lab Results  Component Value Date   CHOL 191 01/19/2019   HDL 49 01/19/2019   LDLCALC 114 (H) 01/19/2019   TRIG 139 01/19/2019   CHOLHDL 3.9 01/19/2019   Lab Results  Component Value Date   TSH 0.762 08/18/2015   No results found for: HGBA1C Lab Results  Component Value Date   WBC 5.7 01/19/2019   HGB 15.4 01/19/2019   HCT 45.4 01/19/2019   MCV 98 (H) 01/19/2019   PLT 184 01/19/2019   Lab Results  Component Value Date   ALT 22 01/19/2019   AST 17 01/19/2019     ALKPHOS 65 01/19/2019   BILITOT 0.6 01/19/2019     Review of Systems  Constitutional: Negative for appetite change, chills, diaphoresis, fatigue and unexpected weight change.  HENT: Positive for sneezing. Negative for hearing loss, tinnitus, trouble swallowing and voice change.   Eyes: Negative for visual disturbance.  Respiratory: Negative for cough, choking, shortness of breath and wheezing.   Cardiovascular: Negative for chest pain, palpitations and leg swelling.  Gastrointestinal: Negative for abdominal pain, blood in stool, constipation and diarrhea.  Genitourinary: Negative for difficulty urinating, dysuria, frequency and hematuria.  Musculoskeletal: Negative for arthralgias, back pain and myalgias.  Skin: Negative for color change and rash.  Neurological: Negative for dizziness, syncope and headaches.  Hematological: Negative for adenopathy.  Psychiatric/Behavioral: Negative for dysphoric mood and sleep disturbance.    Patient Active Problem List   Diagnosis Date Noted  . Plantar fasciitis of right foot 01/18/2018  . Gastroesophageal reflux disease 01/04/2017  . Mild mitral regurgitation 10/20/2016  . 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 Mild intermittent asthma without complication [id number]  . Tubular adenoma of colon 05/21/2015  . Dermatitis of multiple sites 05/21/2015    Allergies  Allergen Reactions  . Diphenhydramine Hcl Other (See Comments)    hyperactivity    Past Surgical History:  Procedure Laterality Date  . BASAL CELL CARCINOMA EXCISION    . COLONOSCOPY  2012   repeat in 3 years    Social History   Tobacco Use  . Smoking status: Never Smoker  . Smokeless tobacco:  Never Used  Substance Use Topics  . Alcohol use: Yes    Alcohol/week: 0.0 standard drinks    Comment: occasional  . Drug use: No     Medication list has been reviewed and updated.  Current Meds  Medication Sig  . atorvastatin (LIPITOR) 10 MG tablet Take 1 tablet (10 mg total)  by mouth daily.  . fluorouracil (EFUDEX) 5 % cream Apply topically 2 (two) times daily.  Marland Kitchen loratadine (CLARITIN) 10 MG tablet Take 10 mg by mouth daily.  . SYMBICORT 160-4.5 MCG/ACT inhaler INHALE 1 PUFF BY MOUTH TWICE A DAY    PHQ 2/9 Scores 01/21/2020 01/19/2019 01/18/2018 11/08/2017  PHQ - 2 Score 0 0 0 0  PHQ- 9 Score 2 - - -    BP Readings from Last 3 Encounters:  01/21/20 124/82  01/19/19 128/84  07/24/18 112/72    Physical Exam Vitals and nursing note reviewed.  Constitutional:      Appearance: Normal appearance. He is well-developed.  HENT:     Head: Normocephalic.     Right Ear: Tympanic membrane, ear canal and external ear normal.     Left Ear: Tympanic membrane, ear canal and external ear normal.     Nose: Nose normal.     Mouth/Throat:     Pharynx: Uvula midline.  Eyes:     Conjunctiva/sclera: Conjunctivae normal.     Pupils: Pupils are equal, round, and reactive to light.  Neck:     Thyroid: No thyromegaly.     Vascular: No carotid bruit.  Cardiovascular:     Rate and Rhythm: Normal rate and regular rhythm.     Heart sounds: Normal heart sounds.  Pulmonary:     Effort: Pulmonary effort is normal.     Breath sounds: Normal breath sounds. No wheezing.  Chest:     Breasts:        Right: No mass.        Left: No mass.  Abdominal:     General: Bowel sounds are normal.     Palpations: Abdomen is soft.     Tenderness: There is no abdominal tenderness.  Musculoskeletal:        General: Normal range of motion.     Cervical back: Normal range of motion and neck supple.     Right lower leg: No edema.     Left lower leg: No edema.  Lymphadenopathy:     Cervical: No cervical adenopathy.  Skin:    General: Skin is warm and dry.     Capillary Refill: Capillary refill takes less than 2 seconds.     Findings: No lesion or rash.     Comments: Numerous areas of sun damage followed by Dermatology  Neurological:     General: No focal deficit present.     Mental  Status: He is alert and oriented to person, place, and time.     Deep Tendon Reflexes: Reflexes are normal and symmetric.  Psychiatric:        Speech: Speech normal.        Behavior: Behavior normal.        Thought Content: Thought content normal.        Judgment: Judgment normal.     Wt Readings from Last 3 Encounters:  01/21/20 231 lb (104.8 kg)  01/19/19 227 lb (103 kg)  07/24/18 226 lb 6.4 oz (102.7 kg)    BP 124/82   Pulse 74   Temp (!) 96.8 F (36 C) (Temporal)  Ht 6' (1.829 m)   Wt 231 lb (104.8 kg)   SpO2 97%   BMI 31.33 kg/m   Assessment and Plan: 1. Annual physical exam Normal exam except for weight Colonoscopy due in 2026 Covid vaccine numbers and website provided - POCT urinalysis dipstick  2. Prostate cancer screening DRE deferred - PSA  3. Mixed hyperlipidemia Tolerating statin medication without side effects at this time Continue same therapy without change at this time. - Comprehensive metabolic panel - Lipid panel - atorvastatin (LIPITOR) 10 MG tablet; Take 1 tablet (10 mg total) by mouth daily.  Dispense: 90 tablet; Refill: 3  4. Mild intermittent asthma without complication Asthma controller medication was not prescribed due to excellent control of asthma. Continue Symbicort daily Claritin as needed for allergy symptoms - CBC with Differential/Platelet   Partially dictated using Editor, commissioning. Any errors are unintentional.  Halina Maidens, MD [facility]  01/21/2020

## 2020-01-22 LAB — CBC WITH DIFFERENTIAL/PLATELET
Basophils Absolute: 0 10*3/uL (ref 0.0–0.2)
Basos: 1 %
EOS (ABSOLUTE): 0.2 10*3/uL (ref 0.0–0.4)
Eos: 5 %
Hematocrit: 45.5 % (ref 37.5–51.0)
Hemoglobin: 15.6 g/dL (ref 13.0–17.7)
Immature Grans (Abs): 0 10*3/uL (ref 0.0–0.1)
Immature Granulocytes: 1 %
Lymphocytes Absolute: 1.5 10*3/uL (ref 0.7–3.1)
Lymphs: 35 %
MCH: 33.6 pg — ABNORMAL HIGH (ref 26.6–33.0)
MCHC: 34.3 g/dL (ref 31.5–35.7)
MCV: 98 fL — ABNORMAL HIGH (ref 79–97)
Monocytes Absolute: 0.4 10*3/uL (ref 0.1–0.9)
Monocytes: 8 %
Neutrophils Absolute: 2.2 10*3/uL (ref 1.4–7.0)
Neutrophils: 50 %
Platelets: 179 10*3/uL (ref 150–450)
RBC: 4.64 x10E6/uL (ref 4.14–5.80)
RDW: 12.7 % (ref 11.6–15.4)
WBC: 4.4 10*3/uL (ref 3.4–10.8)

## 2020-01-22 LAB — COMPREHENSIVE METABOLIC PANEL
ALT: 26 IU/L (ref 0–44)
AST: 22 IU/L (ref 0–40)
Albumin/Globulin Ratio: 2.1 (ref 1.2–2.2)
Albumin: 4.4 g/dL (ref 3.8–4.8)
Alkaline Phosphatase: 74 IU/L (ref 39–117)
BUN/Creatinine Ratio: 12 (ref 10–24)
BUN: 12 mg/dL (ref 8–27)
Bilirubin Total: 0.7 mg/dL (ref 0.0–1.2)
CO2: 25 mmol/L (ref 20–29)
Calcium: 9.1 mg/dL (ref 8.6–10.2)
Chloride: 102 mmol/L (ref 96–106)
Creatinine, Ser: 0.99 mg/dL (ref 0.76–1.27)
GFR calc Af Amer: 94 mL/min/{1.73_m2} (ref >59)
GFR calc non Af Amer: 81 mL/min/{1.73_m2} (ref >59)
Globulin, Total: 2.1 g/dL (ref 1.5–4.5)
Glucose: 92 mg/dL (ref 65–99)
Potassium: 4.4 mmol/L (ref 3.5–5.2)
Sodium: 138 mmol/L (ref 134–144)
Total Protein: 6.5 g/dL (ref 6.0–8.5)

## 2020-01-22 LAB — LIPID PANEL
Chol/HDL Ratio: 3.6 ratio (ref 0.0–5.0)
Cholesterol, Total: 178 mg/dL (ref 100–199)
HDL: 49 mg/dL (ref >39)
LDL Chol Calc (NIH): 114 mg/dL — ABNORMAL HIGH (ref 0–99)
Triglycerides: 81 mg/dL (ref 0–149)
VLDL Cholesterol Cal: 15 mg/dL (ref 5–40)

## 2020-01-22 LAB — PSA: Prostate Specific Ag, Serum: 0.4 ng/mL (ref 0.0–4.0)

## 2020-02-16 ENCOUNTER — Encounter: Payer: Self-pay | Admitting: Internal Medicine

## 2020-07-17 ENCOUNTER — Other Ambulatory Visit: Payer: Self-pay | Admitting: Internal Medicine

## 2020-07-17 DIAGNOSIS — J4520 Mild intermittent asthma, uncomplicated: Secondary | ICD-10-CM

## 2020-08-26 ENCOUNTER — Other Ambulatory Visit: Payer: Self-pay

## 2020-08-26 ENCOUNTER — Emergency Department
Admission: EM | Admit: 2020-08-26 | Discharge: 2020-08-26 | Disposition: A | Discharge status: Home / Self Care | Payer: BC Managed Care – PPO | Attending: Emergency Medicine | Admitting: Emergency Medicine

## 2020-08-26 ENCOUNTER — Emergency Department: Payer: BC Managed Care – PPO

## 2020-08-26 DIAGNOSIS — Z85828 Personal history of other malignant neoplasm of skin: Secondary | ICD-10-CM | POA: Insufficient documentation

## 2020-08-26 DIAGNOSIS — H5702 Anisocoria: Secondary | ICD-10-CM | POA: Diagnosis not present

## 2020-08-26 DIAGNOSIS — J45909 Unspecified asthma, uncomplicated: Secondary | ICD-10-CM | POA: Diagnosis not present

## 2020-08-26 DIAGNOSIS — I6523 Occlusion and stenosis of bilateral carotid arteries: Secondary | ICD-10-CM | POA: Diagnosis not present

## 2020-08-26 DIAGNOSIS — I672 Cerebral atherosclerosis: Secondary | ICD-10-CM | POA: Diagnosis not present

## 2020-08-26 DIAGNOSIS — H5704 Mydriasis: Secondary | ICD-10-CM

## 2020-08-26 LAB — BASIC METABOLIC PANEL
Anion gap: 7 (ref 5–15)
BUN: 17 mg/dL (ref 8–23)
CO2: 28 mmol/L (ref 22–32)
Calcium: 9.2 mg/dL (ref 8.9–10.3)
Chloride: 102 mmol/L (ref 98–111)
Creatinine, Ser: 0.92 mg/dL (ref 0.61–1.24)
GFR, Estimated: >60 mL/min (ref >60)
Glucose, Bld: 124 mg/dL — ABNORMAL HIGH (ref 70–99)
Potassium: 4.1 mmol/L (ref 3.5–5.1)
Sodium: 137 mmol/L (ref 135–145)

## 2020-08-26 MED ORDER — IOHEXOL 350 MG/ML SOLN
75.0000 mL | Freq: Once | INTRAVENOUS | Status: AC | PRN
  Administered 2020-08-26: 75 mL via INTRAVENOUS

## 2020-08-26 NOTE — Discharge Instructions (Addendum)
You have been seen in the emergency department for dilated pupil.  Your CT images of the head neck and upper lung fields with contrast are normal.  Please follow-up with your eye doctor for recheck/reevaluation.  Return to the emergency department for any decrease in vision, headache, slurred speech weakness or numbness of any arm or leg, or any other symptom personally concerning to yourself.

## 2020-08-26 NOTE — ED Provider Notes (Signed)
[facility] Emergency Department Provider Note  Time seen: 2:44 PM  I have reviewed the triage vital signs and the nursing notes.   HISTORY  Chief Complaint possible aneurism   HPI John Mathews is a 64 y.o. male with a past medical history of asthma, presents to the emergency department for a dilated right pupil.  According to the patient 5 weeks ago while he was trimming trees a branch struck him over the right eye.  Patient states he had significant visual loss after the injury and has been following up with the eye doctor ever since.  Patient states he has been going approximately once per week to follow-up.  He states several weeks ago the eye doctor noted that his right eye was dilated.  They had been monitoring the eye but it did not improve, the eye doctor was concerned over possible aneurysm and sent the patient to the emergency department today for evaluation.  Here the patient appears well.  Denies any acute changes in his vision, states of anything the vision in the right eye has been increasing.  No weakness or numbness of any arm or leg confusion or slurred speech.  No other concerning findings on review of systems.   Past Medical History:  Diagnosis Date  . Asthma   . History of basal cell cancer     Patient Active Problem List   Diagnosis Date Noted  . Plantar fasciitis of right foot 01/18/2018  . Gastroesophageal reflux disease 01/04/2017  . Mild mitral regurgitation 10/20/2016  . 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 Mild intermittent asthma without complication 62/83/6629  . Tubular adenoma of colon 05/21/2015  . Dermatitis of multiple sites 05/21/2015    Past Surgical History:  Procedure Laterality Date  . BASAL CELL CARCINOMA EXCISION    . COLONOSCOPY  2012   repeat in 3 years    Prior to Admission medications   Medication Sig Start Date End Date Taking? Authorizing Provider  atorvastatin (LIPITOR) 10 MG tablet Take 1 tablet  (10 mg total) by mouth daily. 01/21/20   Glean Hess, MD  fluorouracil (EFUDEX) 5 % cream Apply topically 2 (two) times daily.    [provider]  loratadine (CLARITIN) 10 MG tablet Take 10 mg by mouth daily.    [provider]  SYMBICORT 160-4.5 MCG/ACT inhaler INHALE 1 PUFF BY MOUTH TWICE A DAY 07/17/20   Glean Hess, MD    Allergies  Allergen Reactions  . Diphenhydramine Hcl Other (See Comments)    hyperactivity    Family History  Problem Relation Age of Onset  . Schizophrenia Mother   . Dementia Father     Social History Social History   Tobacco Use  . Smoking status: Never Smoker  . Smokeless tobacco: Never Used  Substance Use Topics  . Alcohol use: Yes    Alcohol/week: 0.0 standard drinks    Comment: occasional  . Drug use: No    Review of Systems Constitutional: Negative for fever. Eyes: Decreased vision in the right eye x5 weeks now with dilated pupil in the right eye. Cardiovascular: Negative for chest pain. Respiratory: Negative for shortness of breath. Gastrointestinal: Negative for abdominal pain Neurological: Negative for headache All other ROS negative  ____________________________________________   PHYSICAL EXAM:  VITAL SIGNS: ED Triage Vitals  Enc Vitals Group     BP 08/26/20 1227 (!) 162/101     Pulse Rate 08/26/20 1227 65     Resp 08/26/20 1227 16  Temp 08/26/20 1227 98.6 F (37 C)     Temp Source 08/26/20 1227 Oral     SpO2 08/26/20 1227 99 %     Weight 08/26/20 1228 230 lb (104.3 kg)     Height 08/26/20 1228 6' (1.829 m)     Head Circumference --      Peak Flow --      Pain Score 08/26/20 1233 0     Pain Loc --      Pain Edu? --      Excl. in [place]? --     Constitutional: Alert and oriented. Well appearing and in no distress. Eyes: Patient does have an approximate 4 mm right pupil with a 2 to 3 mm left pupil.  Right pupil is very sluggishly and only mildly reactive, left pupil is brisk. ENT      Head:  Normocephalic and atraumatic.      Mouth/Throat: Mucous membranes are moist. Cardiovascular: Normal rate, regular rhythm. Respiratory: Normal respiratory effort without tachypnea nor retractions. Breath sounds are clear and equal bilaterally. No wheezes/rales/rhonchi. Gastrointestinal: Soft and nontender. No distention.  Musculoskeletal: Nontender with normal range of motion in all extremities Neurologic:  Normal speech and language. No gross focal neurologic deficits.  Equal grip strength bilaterally.  No pronator drift. Skin:  Skin is warm, dry and intact.  Psychiatric: Mood and affect are normal.   ____________________________________________   RADIOLOGY  CTA of the head and neck are negative for acute abnormality.  I personally reviewed the images they appear to go through the upper portions of the lung as well without any sign of mass or tumor.  ____________________________________________   INITIAL IMPRESSION / [place] / ED COURSE  Pertinent labs & imaging results that were available during my care of the patient were reviewed by me and considered in my medical decision making (see chart for details).   Patient presents to the emergency department for dilated right pupil found by his eye doctor.  Patient's work-up here in the emergency department is negative including a normal chemistry CTAs of the head and neck including the upper lung fields.  Overall patient appears well.  Reassuring physical exam.  Reassuring neurological exam.  Patient does have a dilated right pupil highly suspect this is due to the patient's eye injury.  We will discharge the patient from the emergency department with ophthalmology follow-up.  Patient agreeable to plan of care.  John Mathews was evaluated in Emergency Department on 08/26/2020 for the symptoms described in the history of present illness. He was evaluated in the context of the global COVID-19 pandemic, which necessitated  consideration that the patient might be at risk for infection with the SARS-CoV-2 virus that causes COVID-19. Institutional protocols and algorithms that pertain to the evaluation of patients at risk for COVID-19 are in a state of rapid change based on information released by regulatory bodies including the CDC and federal and state organizations. These policies and algorithms were followed during the patient's care in the ED.  ____________________________________________   FINAL CLINICAL IMPRESSION(S) / ED DIAGNOSES  Mydriasis   Harvest Dark, MD 08/26/20 1448

## 2020-08-26 NOTE — ED Triage Notes (Signed)
Pt sent by eye dr for possible aneurysm. Pt ambulatory to triage. Reports eye doctor noted rt pupil dilated more than left. Pt denies dizziness, n/v. Denies visual changes, no pain in eye, no headaches. NAD noted at this time.

## 2020-09-29 ENCOUNTER — Ambulatory Visit: Payer: BC Managed Care – PPO | Attending: Internal Medicine

## 2020-09-29 DIAGNOSIS — Z23 Encounter for immunization: Secondary | ICD-10-CM

## 2020-09-29 NOTE — Progress Notes (Signed)
   Covid-19 Vaccination Clinic  Name:  John Mathews    MRN: [id number] DOB: 1956/08/16  09/29/2020  John Mathews was observed post Covid-19 immunization for 15 minutes without incident. He was provided with Vaccine Information Sheet and instruction to access the V-Safe system.   John Mathews was instructed to call 911 with any severe reactions post vaccine: Marland Kitchen Difficulty breathing  . Swelling of face and throat  . A fast heartbeat  . A bad rash all over body  . Dizziness and weakness   Immunizations Administered    No immunizations on file.

## 2020-11-25 DIAGNOSIS — Z859 Personal history of malignant neoplasm, unspecified: Secondary | ICD-10-CM | POA: Diagnosis not present

## 2020-11-25 DIAGNOSIS — Z85828 Personal history of other malignant neoplasm of skin: Secondary | ICD-10-CM | POA: Diagnosis not present

## 2020-11-25 DIAGNOSIS — L570 Actinic keratosis: Secondary | ICD-10-CM | POA: Diagnosis not present

## 2020-11-25 DIAGNOSIS — L578 Other skin changes due to chronic exposure to nonionizing radiation: Secondary | ICD-10-CM | POA: Diagnosis not present

## 2020-11-25 DIAGNOSIS — D485 Neoplasm of uncertain behavior of skin: Secondary | ICD-10-CM | POA: Diagnosis not present

## 2020-11-25 DIAGNOSIS — C4442 Squamous cell carcinoma of skin of scalp and neck: Secondary | ICD-10-CM | POA: Diagnosis not present

## 2020-11-25 DIAGNOSIS — Z872 Personal history of diseases of the skin and subcutaneous tissue: Secondary | ICD-10-CM | POA: Diagnosis not present

## 2020-12-09 DIAGNOSIS — C4442 Squamous cell carcinoma of skin of scalp and neck: Secondary | ICD-10-CM | POA: Diagnosis not present

## 2021-01-06 ENCOUNTER — Other Ambulatory Visit: Payer: Self-pay | Admitting: Internal Medicine

## 2021-01-06 DIAGNOSIS — E782 Mixed hyperlipidemia: Secondary | ICD-10-CM

## 2021-01-09 IMAGING — CT CT ANGIO HEAD
2 of 11 series · 7 of 35 positions shown · IV contrast (omnipaque)
Comparison: None.

CLINICAL DATA: Anisocoria

EXAM:
CT ANGIOGRAPHY HEAD AND NECK
TECHNIQUE: Multidetector CT imaging of the head and neck was performed using
the standard protocol during bolus administration of intravenous
contrast. Multiplanar CT image reconstructions and MIPs were
obtained to evaluate the vascular anatomy. Carotid stenosis
measurements (when applicable) are obtained utilizing NASCET
criteria, using the distal internal carotid diameter as the
denominator.
CONTRAST:  75mL OMNIPAQUE IOHEXOL 350 MG/ML SOLN

[Series 508: cta head neck thins · axial · 0.43mm/px · z∈[-322,-93]mm · 5 of 696 slices shown]
[im 116/696  soft-tissue]
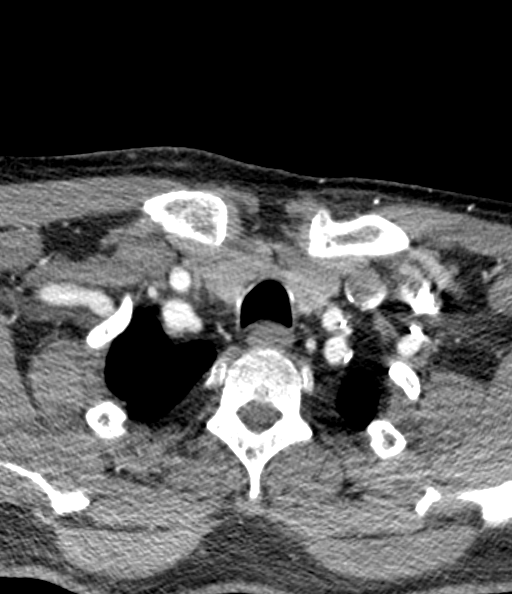
[im 232/696  bone]
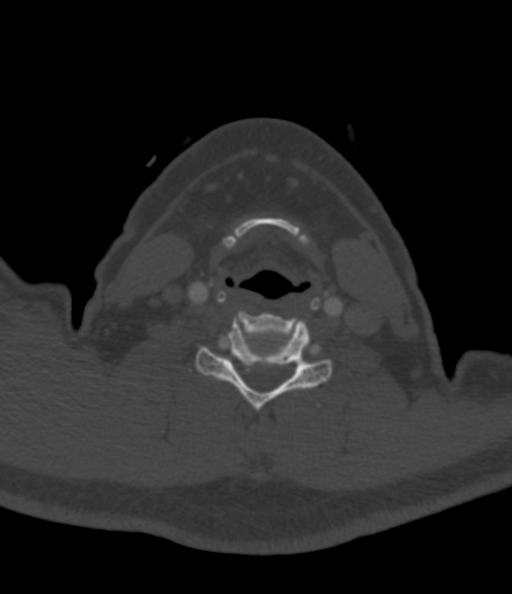
[im 348/696  soft-tissue]
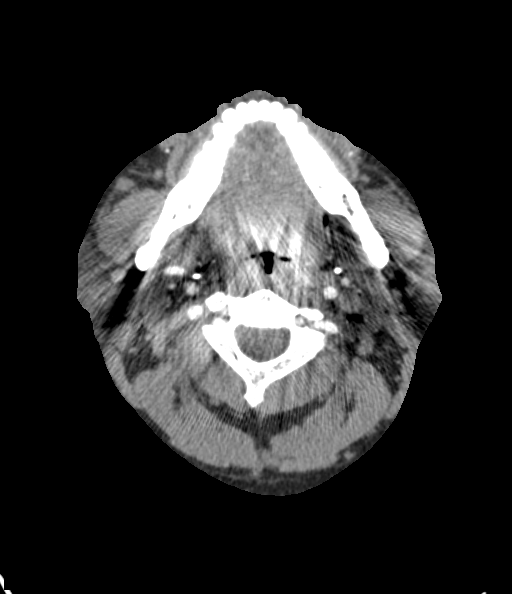
[im 464/696  bone]
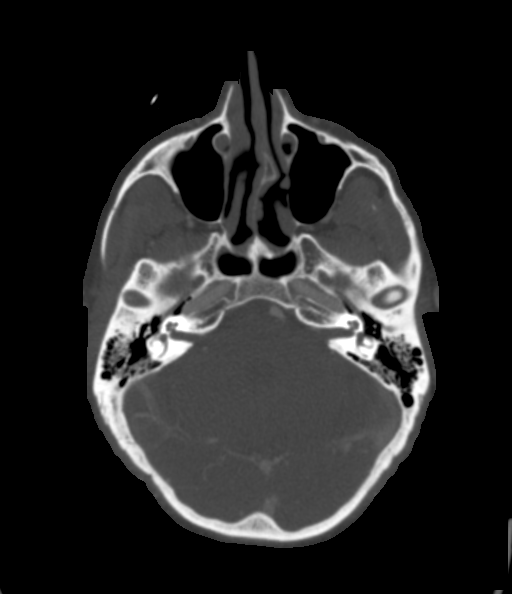
[im 580/696  soft-tissue]
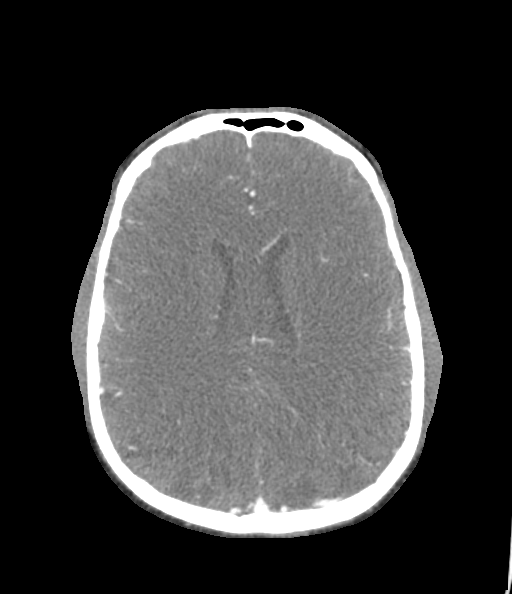

[Series 509: ax thin · axial · 0.43mm/px · z∈[-265,-151]mm · 2 of 348 slices shown]
[im 116/348  soft-tissue]
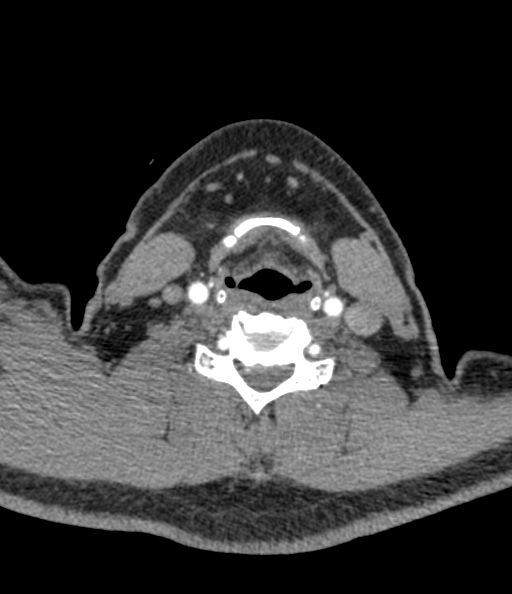
[im 232/348  soft-tissue]
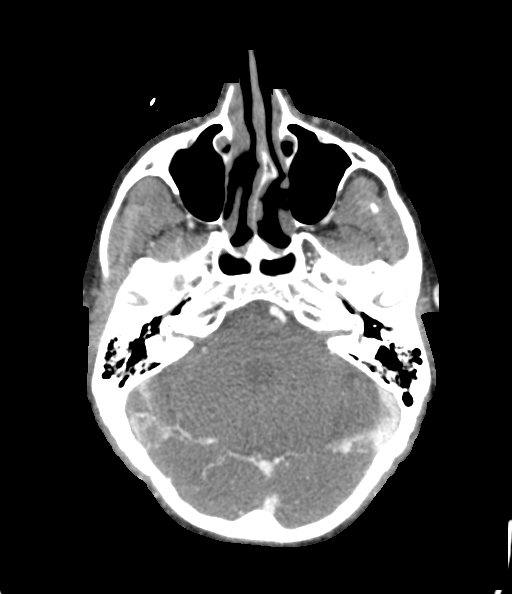

[7 of 35 positions shown; findings below may reference images not displayed]

FINDINGS: CT HEAD

Brain: There is no acute intracranial hemorrhage, mass effect, or
edema. Gray-white differentiation is preserved. There is no
extra-axial fluid collection. Ventricles and sulci are within normal
limits in size and configuration.

Vascular: There is atherosclerotic calcification at the skull base.

Skull: Calvarium is unremarkable.

Sinuses/Orbits: No acute finding.

Other: None.

Review of the MIP images confirms the above findings

CTA NECK

Aortic arch: Great vessel origins are patent. Mild calcified plaque
at the left subclavian origin.

Right carotid system: Patent. Mild calcified plaque at the ICA
origin causing minimal stenosis.

Left carotid system: Patent. Minor mixed plaque at the ICA origin
causing minimal stenosis.

Vertebral arteries: Patent and codominant.

Skeleton: Degenerative changes of the cervical spine.

Other neck: No mass or adenopathy.

Upper chest: No apical lung mass.

Review of the MIP images confirms the above findings

CTA HEAD

Anterior circulation: Intracranial internal carotid arteries are
patent with minimal calcified plaque. Anterior and middle cerebral
arteries are patent.

Posterior circulation: Intracranial vertebral arteries, basilar
artery, and posterior cerebral arteries are patent.

Venous sinuses: Patent as allowed by contrast bolus timing.

Review of the MIP images confirms the above findings
IMPRESSION: No acute intracranial abnormality. No aneurysm. No hemodynamically
significant stenosis in the neck.

## 2021-01-21 ENCOUNTER — Other Ambulatory Visit: Payer: Self-pay

## 2021-01-21 ENCOUNTER — Encounter: Payer: Self-pay | Admitting: Internal Medicine

## 2021-01-21 ENCOUNTER — Ambulatory Visit (INDEPENDENT_AMBULATORY_CARE_PROVIDER_SITE_OTHER): Payer: BC Managed Care – PPO | Admitting: Internal Medicine

## 2021-01-21 VITALS — BP 112/82 | HR 75 | Temp 97.9°F | Ht 72.0 in | Wt 235.0 lb

## 2021-01-21 DIAGNOSIS — J4520 Mild intermittent asthma, uncomplicated: Secondary | ICD-10-CM | POA: Diagnosis not present

## 2021-01-21 DIAGNOSIS — Z125 Encounter for screening for malignant neoplasm of prostate: Secondary | ICD-10-CM | POA: Diagnosis not present

## 2021-01-21 DIAGNOSIS — Z0000 Encounter for general adult medical examination without abnormal findings: Secondary | ICD-10-CM

## 2021-01-21 DIAGNOSIS — E782 Mixed hyperlipidemia: Secondary | ICD-10-CM | POA: Diagnosis not present

## 2021-01-21 DIAGNOSIS — Z85828 Personal history of other malignant neoplasm of skin: Secondary | ICD-10-CM

## 2021-01-21 MED ORDER — BUDESONIDE-FORMOTEROL FUMARATE 160-4.5 MCG/ACT IN AERO: INHALATION_SPRAY | RESPIRATORY_TRACT | 3 refills | Status: DC

## 2021-01-21 NOTE — Progress Notes (Signed)
Date:  01/21/2021   Name:  John Mathews   DOB:  05/23/1956   MRN:  [id number]   Chief Complaint: Annual Exam  John Mathews is a 65 y.o. male who presents today for his Complete Annual Exam. He feels well. He reports exercising none. He reports he is sleeping well. He continues to work as a Oceanographer.  Colonoscopy: 09/2015 2 hyperplastic polyps  Immunization History  Administered Date(s) Administered  . Influenza,inj,Quad PF,6+ Mos 07/24/2018, 08/17/2019  . Influenza-Unspecified 07/19/2015, 08/27/2017, 08/01/2020  . Moderna SARS-COV2 Booster Vaccination 09/29/2020  . Pneumococcal Polysaccharide-23 01/12/2011  . Tdap 04/20/2011  . Zoster 01/24/2014  . Zoster Recombinat (Shingrix) 01/19/2019, 04/23/2019    Asthma There is no cough, difficulty breathing, shortness of breath or wheezing. The problem occurs rarely. The problem has been resolved. Pertinent negatives include no appetite change, chest pain, headaches, myalgias or trouble swallowing. His symptoms are alleviated by beta-agonist and steroid inhaler. He reports complete (has not used albuterol in years) improvement on treatment. His past medical history is significant for asthma.  Hyperlipidemia This is a chronic problem. The problem is controlled. Pertinent negatives include no chest pain, myalgias or shortness of breath. Current antihyperlipidemic treatment includes statins. The current treatment provides significant improvement of lipids.    Lab Results  Component Value Date   CREATININE 0.92 08/26/2020   BUN 17 08/26/2020   NA 137 08/26/2020   K 4.1 08/26/2020   CL 102 08/26/2020   CO2 28 08/26/2020   Lab Results  Component Value Date   CHOL 178 01/21/2020   HDL 49 01/21/2020   LDLCALC 114 (H) 01/21/2020   TRIG 81 01/21/2020   CHOLHDL 3.6 01/21/2020   Lab Results  Component Value Date   TSH 0.762 08/18/2015   No results found for: HGBA1C Lab Results  Component Value Date   WBC 4.4  01/21/2020   HGB 15.6 01/21/2020   HCT 45.5 01/21/2020   MCV 98 (H) 01/21/2020   PLT 179 01/21/2020   Lab Results  Component Value Date   ALT 26 01/21/2020   AST 22 01/21/2020   ALKPHOS 74 01/21/2020   BILITOT 0.7 01/21/2020     Review of Systems  Constitutional: Negative for appetite change, chills, diaphoresis, fatigue and unexpected weight change.  HENT: Negative for hearing loss, tinnitus, trouble swallowing and voice change.   Eyes: Negative for visual disturbance (has dilated right pupil since eye injury last year).  Respiratory: Negative for cough, choking, shortness of breath and wheezing.   Cardiovascular: Negative for chest pain, palpitations and leg swelling.  Gastrointestinal: Negative for abdominal pain, blood in stool, constipation and diarrhea.  Genitourinary: Negative for difficulty urinating, dysuria and frequency.  Musculoskeletal: Negative for arthralgias, back pain and myalgias.  Skin: Negative for color change and rash.  Allergic/Immunologic: Negative for environmental allergies.  Neurological: Negative for dizziness, syncope and headaches.  Hematological: Negative for adenopathy.  Psychiatric/Behavioral: Negative for dysphoric mood and sleep disturbance.    Patient Active Problem List   Diagnosis Date Noted  . Hx of squamous cell carcinoma of skin 01/21/2021  . Plantar fasciitis of right foot 01/18/2018  . Gastroesophageal reflux disease 01/04/2017  . Mild mitral regurgitation 10/20/2016  . 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 Mild intermittent asthma without complication 97/67/3419  . Tubular adenoma of colon 05/21/2015  . Dermatitis of multiple sites 05/21/2015    Allergies  Allergen Reactions  . Diphenhydramine Hcl Other (See Comments)    hyperactivity  Past Surgical History:  Procedure Laterality Date  . BASAL CELL CARCINOMA EXCISION    . COLONOSCOPY  2012   repeat in 3 years    Social History   Tobacco Use  . Smoking status: Never  Smoker  . Smokeless tobacco: Never Used  Substance Use Topics  . Alcohol use: Yes    Alcohol/week: 0.0 standard drinks    Comment: occasional  . Drug use: No     Medication list has been reviewed and updated.  Current Meds  Medication Sig  . atorvastatin (LIPITOR) 10 MG tablet TAKE 1 TABLET BY MOUTH EVERY DAY  . fluorouracil (EFUDEX) 5 % cream Apply topically 2 (two) times daily.  Marland Kitchen loratadine (CLARITIN) 10 MG tablet Take 10 mg by mouth daily.  . [DISCONTINUED] SYMBICORT 160-4.5 MCG/ACT inhaler INHALE 1 PUFF BY MOUTH TWICE A DAY    PHQ 2/9 Scores 01/21/2021 01/21/2020 01/19/2019 01/18/2018  PHQ - 2 Score 0 0 0 0  PHQ- 9 Score 0 2 - -    GAD 7 : Generalized Anxiety Score 01/21/2021  Nervous, Anxious, on Edge 0  Control/stop worrying 0  Worry too much - different things 0  Trouble relaxing 0  Restless 0  Easily annoyed or irritable 0  Afraid - awful might happen 0  Total GAD 7 Score 0    BP Readings from Last 3 Encounters:  01/21/21 112/82  08/26/20 (!) 162/101  01/21/20 124/82    Physical Exam Vitals and nursing note reviewed.  Constitutional:      Appearance: Normal appearance. He is well-developed.  HENT:     Head: Normocephalic.     Right Ear: Tympanic membrane, ear canal and external ear normal.     Left Ear: Tympanic membrane, ear canal and external ear normal.     Nose: Nose normal.  Eyes:     Extraocular Movements: Extraocular movements intact.     Conjunctiva/sclera: Conjunctivae normal.     Pupils: Pupils are unequal.  Neck:     Thyroid: No thyromegaly.     Vascular: No carotid bruit.  Cardiovascular:     Rate and Rhythm: Normal rate and regular rhythm.     Pulses: Normal pulses.     Heart sounds: Normal heart sounds.  Pulmonary:     Effort: Pulmonary effort is normal.     Breath sounds: Normal breath sounds. No wheezing.  Chest:  Breasts:     Right: No mass.     Left: No mass.    Abdominal:     General: Bowel sounds are normal.      Palpations: Abdomen is soft.     Tenderness: There is no abdominal tenderness.  Musculoskeletal:        General: Normal range of motion.     Cervical back: Normal range of motion and neck supple.     Right lower leg: No edema.     Left lower leg: No edema.  Lymphadenopathy:     Cervical: No cervical adenopathy.  Skin:    General: Skin is warm and dry.     Capillary Refill: Capillary refill takes less than 2 seconds.     Findings: Wound (healed SCCA surgical site behind right ear) present.  Neurological:     General: No focal deficit present.     Mental Status: He is alert and oriented to person, place, and time.     Deep Tendon Reflexes: Reflexes are normal and symmetric.  Psychiatric:        Attention and  Perception: Attention normal.        Mood and Affect: Mood normal.        Behavior: Behavior normal.     Wt Readings from Last 3 Encounters:  01/21/21 235 lb (106.6 kg)  08/26/20 230 lb (104.3 kg)  01/21/20 231 lb (104.8 kg)    BP 112/82   Pulse 75   Temp 97.9 F (36.6 C) (Oral)   Ht 6' (1.829 m)   Wt 235 lb (106.6 kg)   SpO2 96%   BMI 31.87 kg/m   Assessment and Plan: 1. Annual physical exam Exam is normal except for weight. Encourage regular exercise and appropriate dietary changes. Colonoscopy due in 2026  2. Prostate cancer screening DRE deferred to lack of sx - PSA  3. Mild intermittent asthma without complication Asthma is well controlled on Symbicort PPV-23 has been given - CBC with Differential/Platelet - budesonide-formoterol (SYMBICORT) 160-4.5 MCG/ACT inhaler; INHALE 1 PUFF BY MOUTH TWICE A DAY  Dispense: 30.6 each; Refill: 3  4. Mixed hyperlipidemia Tolerating statin medication without side effects at this time LDL is at goal of < 70 on current dose Continue same therapy without change at this time. - Comprehensive metabolic panel - Lipid panel  5. Hx of squamous cell carcinoma of skin Continue regular follow up with  Dermatology   Partially dictated using Dragon software. Any errors are unintentional.  Halina Maidens, MD [facility]  01/21/2021

## 2021-01-22 ENCOUNTER — Encounter: Payer: Self-pay | Admitting: Internal Medicine

## 2021-01-22 LAB — CBC WITH DIFFERENTIAL/PLATELET
Basophils Absolute: 0 10*3/uL (ref 0.0–0.2)
Basos: 1 %
EOS (ABSOLUTE): 0.3 10*3/uL (ref 0.0–0.4)
Eos: 5 %
Hematocrit: 42.8 % (ref 37.5–51.0)
Hemoglobin: 14.9 g/dL (ref 13.0–17.7)
Immature Grans (Abs): 0 10*3/uL (ref 0.0–0.1)
Immature Granulocytes: 1 %
Lymphocytes Absolute: 1.4 10*3/uL (ref 0.7–3.1)
Lymphs: 28 %
MCH: 33.3 pg — ABNORMAL HIGH (ref 26.6–33.0)
MCHC: 34.8 g/dL (ref 31.5–35.7)
MCV: 96 fL (ref 79–97)
Monocytes Absolute: 0.5 10*3/uL (ref 0.1–0.9)
Monocytes: 9 %
Neutrophils Absolute: 2.8 10*3/uL (ref 1.4–7.0)
Neutrophils: 56 %
Platelets: 187 10*3/uL (ref 150–450)
RBC: 4.48 x10E6/uL (ref 4.14–5.80)
RDW: 12.8 % (ref 11.6–15.4)
WBC: 4.9 10*3/uL (ref 3.4–10.8)

## 2021-01-22 LAB — COMPREHENSIVE METABOLIC PANEL
ALT: 20 IU/L (ref 0–44)
AST: 21 IU/L (ref 0–40)
Albumin/Globulin Ratio: 1.7 (ref 1.2–2.2)
Albumin: 4.3 g/dL (ref 3.8–4.8)
Alkaline Phosphatase: 62 IU/L (ref 44–121)
BUN/Creatinine Ratio: 13 (ref 10–24)
BUN: 13 mg/dL (ref 8–27)
Bilirubin Total: 0.9 mg/dL (ref 0.0–1.2)
CO2: 24 mmol/L (ref 20–29)
Calcium: 9.1 mg/dL (ref 8.6–10.2)
Chloride: 101 mmol/L (ref 96–106)
Creatinine, Ser: 1 mg/dL (ref 0.76–1.27)
Globulin, Total: 2.6 g/dL (ref 1.5–4.5)
Glucose: 86 mg/dL (ref 65–99)
Potassium: 4.3 mmol/L (ref 3.5–5.2)
Sodium: 139 mmol/L (ref 134–144)
Total Protein: 6.9 g/dL (ref 6.0–8.5)
eGFR: 85 mL/min/{1.73_m2} (ref >59)

## 2021-01-22 LAB — LIPID PANEL
Chol/HDL Ratio: 3.9 ratio (ref 0.0–5.0)
Cholesterol, Total: 175 mg/dL (ref 100–199)
HDL: 45 mg/dL (ref >39)
LDL Chol Calc (NIH): 116 mg/dL — ABNORMAL HIGH (ref 0–99)
Triglycerides: 76 mg/dL (ref 0–149)
VLDL Cholesterol Cal: 14 mg/dL (ref 5–40)

## 2021-01-22 LAB — PSA: Prostate Specific Ag, Serum: 0.4 ng/mL (ref 0.0–4.0)

## 2021-01-22 NOTE — Progress Notes (Signed)
Labs are all normal.

## 2021-04-03 ENCOUNTER — Other Ambulatory Visit: Payer: Self-pay | Admitting: Internal Medicine

## 2021-04-03 DIAGNOSIS — E782 Mixed hyperlipidemia: Secondary | ICD-10-CM

## 2021-05-05 ENCOUNTER — Other Ambulatory Visit: Payer: Self-pay

## 2021-05-05 ENCOUNTER — Ambulatory Visit: Payer: BC Managed Care – PPO | Admitting: Internal Medicine

## 2021-05-05 ENCOUNTER — Encounter: Payer: Self-pay | Admitting: Internal Medicine

## 2021-05-05 VITALS — BP 118/60 | HR 75 | Temp 98.0°F | Ht 72.0 in | Wt 235.0 lb

## 2021-05-05 DIAGNOSIS — H1032 Unspecified acute conjunctivitis, left eye: Secondary | ICD-10-CM

## 2021-05-05 MED ORDER — NEOMYCIN-POLYMYXIN-DEXAMETH 3.5-10000-0.1 OP SUSP: 2.0000 [drp] | Freq: Four times a day (QID) | OPHTHALMIC | 1 refills | Status: AC

## 2021-05-05 NOTE — Progress Notes (Signed)
Date:  05/05/2021   Name:  John Mathews   DOB:  10-02-1956   MRN:  [id number]   Chief Complaint: Eye Problem (In the sun its burning and itching. Otherwise his eye is just very red. No drainage. Started Sunday x 2 days ago. )  Eye Problem  The left eye is affected. This is a new problem. The current episode started yesterday. The problem occurs constantly. The problem has been unchanged. There was no injury mechanism. The patient is experiencing no pain. There is No known exposure to pink eye. He Does not wear contacts. Associated symptoms include an eye discharge, eye redness and photophobia. Pertinent negatives include no fever.   Lab Results  Component Value Date   CREATININE 1.00 01/21/2021   BUN 13 01/21/2021   NA 139 01/21/2021   K 4.3 01/21/2021   CL 101 01/21/2021   CO2 24 01/21/2021   Lab Results  Component Value Date   CHOL 175 01/21/2021   HDL 45 01/21/2021   LDLCALC 116 (H) 01/21/2021   TRIG 76 01/21/2021   CHOLHDL 3.9 01/21/2021   Lab Results  Component Value Date   TSH 0.762 08/18/2015   No results found for: HGBA1C Lab Results  Component Value Date   WBC 4.9 01/21/2021   HGB 14.9 01/21/2021   HCT 42.8 01/21/2021   MCV 96 01/21/2021   PLT 187 01/21/2021   Lab Results  Component Value Date   ALT 20 01/21/2021   AST 21 01/21/2021   ALKPHOS 62 01/21/2021   BILITOT 0.9 01/21/2021     Review of Systems  Constitutional:  Negative for chills and fever.  Eyes:  Positive for photophobia, discharge, redness and visual disturbance.  Respiratory:  Negative for chest tightness and shortness of breath.   Cardiovascular:  Negative for chest pain.   Patient Active Problem List   Diagnosis Date Noted   Hx of squamous cell carcinoma of skin 01/21/2021   Plantar fasciitis of right foot 01/18/2018   Gastroesophageal reflux disease 01/04/2017   Mild mitral regurgitation 10/20/2016   Mixed hyperlipidemia 08/19/2016   Mild intermittent asthma without  complication 17/51/0258   Tubular adenoma of colon 05/21/2015   Dermatitis of multiple sites 05/21/2015    Allergies  Allergen Reactions   Diphenhydramine Hcl Other (See Comments)    hyperactivity    Past Surgical History:  Procedure Laterality Date   BASAL CELL CARCINOMA EXCISION     COLONOSCOPY  2012   repeat in 3 years    Social History   Tobacco Use   Smoking status: Never   Smokeless tobacco: Never  Substance Use Topics   Alcohol use: Yes    Alcohol/week: 0.0 standard drinks    Comment: occasional   Drug use: No     Medication list has been reviewed and updated.  Current Meds  Medication Sig   atorvastatin (LIPITOR) 10 MG tablet TAKE 1 TABLET BY MOUTH EVERY DAY   budesonide-formoterol (SYMBICORT) 160-4.5 MCG/ACT inhaler INHALE 1 PUFF BY MOUTH TWICE A DAY   fluorouracil (EFUDEX) 5 % cream Apply topically 2 (two) times daily.   loratadine (CLARITIN) 10 MG tablet Take 10 mg by mouth daily.    PHQ 2/9 Scores 05/05/2021 01/21/2021 01/21/2020 01/19/2019  PHQ - 2 Score 0 0 0 0  PHQ- 9 Score 0 0 2 -    GAD 7 : Generalized Anxiety Score 05/05/2021 01/21/2021  Nervous, Anxious, on Edge 0 0  Control/stop worrying 0 0  Worry  too much - different things 0 0  Trouble relaxing 0 0  Restless 0 0  Easily annoyed or irritable 0 0  Afraid - awful might happen 0 0  Total GAD 7 Score 0 0  Anxiety Difficulty Not difficult at all -    BP Readings from Last 3 Encounters:  05/05/21 118/60  01/21/21 112/82  08/26/20 (!) 162/101    Physical Exam Vitals and nursing note reviewed.  Constitutional:      General: He is not in acute distress.    Appearance: He is well-developed.  HENT:     Head: Normocephalic and atraumatic.  Eyes:     General: Lids are normal.     Extraocular Movements: Extraocular movements intact.     Conjunctiva/sclera:     Right eye: Right conjunctiva is not injected. No exudate or hemorrhage.    Left eye: Left conjunctiva is injected. Chemosis present.   Pulmonary:     Effort: Pulmonary effort is normal. No respiratory distress.  Skin:    General: Skin is warm and dry.     Findings: No rash.  Neurological:     Mental Status: He is alert and oriented to person, place, and time.  Psychiatric:        Mood and Affect: Mood normal.        Behavior: Behavior normal.    Wt Readings from Last 3 Encounters:  05/05/21 235 lb (106.6 kg)  01/21/21 235 lb (106.6 kg)  08/26/20 230 lb (104.3 kg)    BP 118/60 (BP Location: Right Arm, Patient Position: Sitting, Cuff Size: Large)   Pulse 75   Temp 98 F (36.7 C) (Oral)   Ht 6' (1.829 m)   Wt 235 lb (106.6 kg)   SpO2 96%   BMI 31.87 kg/m   Assessment and Plan: 1. Acute bacterial conjunctivitis of left eye Use eye drops qid in left eye for at least 5 days If no response to drops, then recommend Ophthalmology evaluation - neomycin-polymyxin b-dexamethasone (MAXITROL) 3.5-10000-0.1 SUSP; Place 2 drops into both eyes every 6 (six) hours for 7 days.  Dispense: 5 mL; Refill: 1   Partially dictated using Editor, commissioning. Any errors are unintentional.  Halina Maidens, MD [facility]  05/05/2021

## 2021-06-09 DIAGNOSIS — Z85828 Personal history of other malignant neoplasm of skin: Secondary | ICD-10-CM | POA: Diagnosis not present

## 2021-06-09 DIAGNOSIS — Z859 Personal history of malignant neoplasm, unspecified: Secondary | ICD-10-CM | POA: Diagnosis not present

## 2021-06-09 DIAGNOSIS — L578 Other skin changes due to chronic exposure to nonionizing radiation: Secondary | ICD-10-CM | POA: Diagnosis not present

## 2021-06-09 DIAGNOSIS — L570 Actinic keratosis: Secondary | ICD-10-CM | POA: Diagnosis not present

## 2021-06-09 DIAGNOSIS — Z872 Personal history of diseases of the skin and subcutaneous tissue: Secondary | ICD-10-CM | POA: Diagnosis not present

## 2021-06-09 DIAGNOSIS — D485 Neoplasm of uncertain behavior of skin: Secondary | ICD-10-CM | POA: Diagnosis not present

## 2021-07-13 DIAGNOSIS — L570 Actinic keratosis: Secondary | ICD-10-CM | POA: Diagnosis not present

## 2021-11-20 ENCOUNTER — Other Ambulatory Visit: Payer: Self-pay | Admitting: Internal Medicine

## 2021-11-20 DIAGNOSIS — E782 Mixed hyperlipidemia: Secondary | ICD-10-CM

## 2021-11-21 NOTE — Telephone Encounter (Signed)
Requested Prescriptions  Pending Prescriptions Disp Refills   atorvastatin (LIPITOR) 10 MG tablet [Pharmacy Med Name: ATORVASTATIN 10 MG TABLET] 90 tablet 0    Sig: TAKE 1 TABLET BY MOUTH EVERY DAY     Cardiovascular:  Antilipid - Statins Failed - 11/20/2021  3:01 PM      Failed - LDL in normal range and within 360 days    LDL Chol Calc (NIH)  Date Value Ref Range Status  01/21/2021 116 (H) 0 - 99 mg/dL Final         Passed - Total Cholesterol in normal range and within 360 days    Cholesterol, Total  Date Value Ref Range Status  01/21/2021 175 100 - 199 mg/dL Final         Passed - HDL in normal range and within 360 days    HDL  Date Value Ref Range Status  01/21/2021 45 >39 mg/dL Final         Passed - Triglycerides in normal range and within 360 days    Triglycerides  Date Value Ref Range Status  01/21/2021 76 0 - 149 mg/dL Final         Passed - Patient is not pregnant      Passed - Valid encounter within last 12 months    Recent Outpatient Visits          6 months ago Acute bacterial conjunctivitis of left eye   [facility] Glean Hess, MD   10 months ago Annual physical exam   [facility] Glean Hess, MD   1 year ago Annual physical exam   [facility] Glean Hess, MD   2 years ago Annual physical exam   [facility] Glean Hess, MD   3 years ago Mixed hyperlipidemia   [facility] Glean Hess, MD      Future Appointments            In 2 months [facility] Melia Jesse Sans, MD [facility], [facility]

## 2021-12-14 DIAGNOSIS — L578 Other skin changes due to chronic exposure to nonionizing radiation: Secondary | ICD-10-CM | POA: Diagnosis not present

## 2021-12-14 DIAGNOSIS — Z859 Personal history of malignant neoplasm, unspecified: Secondary | ICD-10-CM | POA: Diagnosis not present

## 2021-12-14 DIAGNOSIS — Z85828 Personal history of other malignant neoplasm of skin: Secondary | ICD-10-CM | POA: Diagnosis not present

## 2021-12-14 DIAGNOSIS — L570 Actinic keratosis: Secondary | ICD-10-CM | POA: Diagnosis not present

## 2021-12-14 DIAGNOSIS — D485 Neoplasm of uncertain behavior of skin: Secondary | ICD-10-CM | POA: Diagnosis not present

## 2021-12-14 DIAGNOSIS — Z872 Personal history of diseases of the skin and subcutaneous tissue: Secondary | ICD-10-CM | POA: Diagnosis not present

## 2022-01-25 DIAGNOSIS — L570 Actinic keratosis: Secondary | ICD-10-CM | POA: Diagnosis not present

## 2022-01-26 ENCOUNTER — Encounter: Payer: Self-pay | Admitting: Internal Medicine

## 2022-01-26 ENCOUNTER — Other Ambulatory Visit: Payer: Self-pay

## 2022-01-26 ENCOUNTER — Ambulatory Visit (INDEPENDENT_AMBULATORY_CARE_PROVIDER_SITE_OTHER): Payer: BC Managed Care – PPO | Admitting: Internal Medicine

## 2022-01-26 VITALS — BP 124/62 | HR 64 | Resp 12 | Ht 72.0 in | Wt 232.2 lb

## 2022-01-26 DIAGNOSIS — Z125 Encounter for screening for malignant neoplasm of prostate: Secondary | ICD-10-CM

## 2022-01-26 DIAGNOSIS — Z0000 Encounter for general adult medical examination without abnormal findings: Secondary | ICD-10-CM | POA: Diagnosis not present

## 2022-01-26 DIAGNOSIS — J4520 Mild intermittent asthma, uncomplicated: Secondary | ICD-10-CM

## 2022-01-26 DIAGNOSIS — E782 Mixed hyperlipidemia: Secondary | ICD-10-CM | POA: Diagnosis not present

## 2022-01-26 DIAGNOSIS — Z23 Encounter for immunization: Secondary | ICD-10-CM | POA: Diagnosis not present

## 2022-01-26 MED ORDER — BUDESONIDE-FORMOTEROL FUMARATE 160-4.5 MCG/ACT IN AERO: INHALATION_SPRAY | RESPIRATORY_TRACT | 3 refills | Status: DC

## 2022-01-26 MED ORDER — ATORVASTATIN CALCIUM 10 MG PO TABS: 10.0000 mg | ORAL_TABLET | Freq: Every day | ORAL | 3 refills | Status: DC

## 2022-01-26 NOTE — Progress Notes (Signed)
? ? ?Date:  01/26/2022  ? ?Name:  John Mathews   DOB:  February 22, 1956   MRN:  [id number] ? ? ?Chief Complaint: Annual Exam ?John Mathews is a 66 y.o. male who presents today for his Complete Annual Exam. He feels well. He reports exercising. He reports he is sleeping fairly well.  ? ?Colonoscopy: 09/2015 repeat 10 yrs ? ?Immunization History  ?Administered Date(s) Administered  ? Influenza,inj,Quad PF,6+ Mos 07/24/2018, 08/17/2019, 09/12/2021  ? Influenza-Unspecified 07/19/2015, 08/27/2017, 08/01/2020  ? Moderna SARS-COV2 Booster Vaccination 09/29/2020  ? Pneumococcal Polysaccharide-23 01/12/2011  ? Tdap 04/20/2011  ? Zoster Recombinat (Shingrix) 01/19/2019, 04/23/2019  ? Zoster, Live 01/24/2014  ? ?Health Maintenance Due  ?Topic Date Due  ? HIV Screening  Never done  ? TETANUS/TDAP  04/19/2021  ?  ?Lab Results  ?Component Value Date  ? PSA1 0.4 01/21/2021  ? PSA1 0.4 01/21/2020  ? PSA1 0.4 01/19/2019  ? ? ?Asthma ?There is no shortness of breath or wheezing. This is a recurrent problem. The problem has been unchanged. Pertinent negatives include no appetite change, chest pain, headaches, myalgias or trouble swallowing. His symptoms are alleviated by beta-agonist and steroid inhaler. He reports significant improvement on treatment. His past medical history is significant for asthma.  ?Hyperlipidemia ?This is a chronic problem. The problem is controlled. Pertinent negatives include no chest pain, myalgias or shortness of breath. Current antihyperlipidemic treatment includes statins.  ? ?Lab Results  ?Component Value Date  ? NA 139 01/21/2021  ? K 4.3 01/21/2021  ? CO2 24 01/21/2021  ? GLUCOSE 86 01/21/2021  ? BUN 13 01/21/2021  ? CREATININE 1.00 01/21/2021  ? CALCIUM 9.1 01/21/2021  ? EGFR 85 01/21/2021  ? GFRNONAA >60 08/26/2020  ? ?Lab Results  ?Component Value Date  ? CHOL 175 01/21/2021  ? HDL 45 01/21/2021  ? LDLCALC 116 (H) 01/21/2021  ? TRIG 76 01/21/2021  ? CHOLHDL 3.9 01/21/2021  ? ?Lab Results   ?Component Value Date  ? TSH 0.762 08/18/2015  ? ?No results found for: HGBA1C ?Lab Results  ?Component Value Date  ? WBC 4.9 01/21/2021  ? HGB 14.9 01/21/2021  ? HCT 42.8 01/21/2021  ? MCV 96 01/21/2021  ? PLT 187 01/21/2021  ? ?Lab Results  ?Component Value Date  ? ALT 20 01/21/2021  ? AST 21 01/21/2021  ? ALKPHOS 62 01/21/2021  ? BILITOT 0.9 01/21/2021  ? ?No results found for: 25OHVITD2, [place], VD25OH  ? ?Review of Systems  ?Constitutional:  Negative for appetite change, chills, diaphoresis, fatigue and unexpected weight change.  ?HENT:  Negative for hearing loss, tinnitus, trouble swallowing and voice change.   ?Eyes:  Negative for visual disturbance.  ?Respiratory:  Negative for choking, shortness of breath and wheezing.   ?Cardiovascular:  Negative for chest pain, palpitations and leg swelling.  ?Gastrointestinal:  Negative for abdominal pain, blood in stool, constipation and diarrhea.  ?Genitourinary:  Negative for difficulty urinating, dysuria and frequency.  ?Musculoskeletal:  Negative for arthralgias, back pain and myalgias.  ?Skin:  Negative for color change and rash.  ?Allergic/Immunologic: Positive for environmental allergies.  ?Neurological:  Negative for dizziness, syncope and headaches.  ?Hematological:  Negative for adenopathy.  ?Psychiatric/Behavioral:  Negative for dysphoric mood and sleep disturbance. The patient is not nervous/anxious.   ? ?Patient Active Problem List  ? Diagnosis Date Noted  ? Hx of squamous cell carcinoma of skin 01/21/2021  ? Plantar fasciitis of right foot 01/18/2018  ? Gastroesophageal reflux disease 01/04/2017  ?  Mild mitral regurgitation 10/20/2016  ? Mixed hyperlipidemia 08/19/2016  ? Mild intermittent asthma without complication 71/21/9758  ? Tubular adenoma of colon 05/21/2015  ? Dermatitis of multiple sites 05/21/2015  ? ? ?Allergies  ?Allergen Reactions  ? Diphenhydramine Hcl Other (See Comments)  ?  hyperactivity  ? ? ?Past Surgical History:  ?Procedure  Laterality Date  ? BASAL CELL CARCINOMA EXCISION    ? COLONOSCOPY  2012  ? repeat in 3 years  ? ? ?Social History  ? ?Tobacco Use  ? Smoking status: Never  ? Smokeless tobacco: Never  ?Substance Use Topics  ? Alcohol use: Yes  ?  Alcohol/week: 0.0 standard drinks  ?  Comment: occasional  ? Drug use: No  ? ? ? ?Medication list has been reviewed and updated. ? ?No outpatient medications have been marked as taking for the 01/26/22 encounter (Office Visit) with Glean Hess, MD.  ? ? ? ?  01/26/2022  ?  8:38 AM 05/05/2021  ? 11:02 AM 01/21/2021  ?  8:41 AM 01/21/2020  ?  8:14 AM  ?PHQ 2/9 Scores  ?PHQ - 2 Score 0 0 0 0  ?PHQ- 9 Score 0 0 0 2  ? ? ? ?  01/26/2022  ?  8:38 AM 05/05/2021  ? 11:02 AM 01/21/2021  ?  8:41 AM  ?GAD 7 : Generalized Anxiety Score  ?Nervous, Anxious, on Edge 0 0 0  ?Control/stop worrying 0 0 0  ?Worry too much - different things 0 0 0  ?Trouble relaxing 0 0 0  ?Restless 0 0 0  ?Easily annoyed or irritable 0 0 0  ?Afraid - awful might happen 0 0 0  ?Total GAD 7 Score 0 0 0  ?Anxiety Difficulty Not difficult at all Not difficult at all   ? ? ?BP Readings from Last 3 Encounters:  ?01/26/22 124/62  ?05/05/21 118/60  ?01/21/21 112/82  ? ? ?Physical Exam ?Vitals and nursing note reviewed.  ?Constitutional:   ?   Appearance: Normal appearance. He is well-developed.  ?HENT:  ?   Head: Normocephalic.  ?   Right Ear: Tympanic membrane, ear canal and external ear normal.  ?   Left Ear: Tympanic membrane, ear canal and external ear normal.  ?   Nose: Nose normal.  ?Eyes:  ?   Conjunctiva/sclera: Conjunctivae normal.  ?   Pupils: Pupils are equal, round, and reactive to light.  ?Neck:  ?   Thyroid: No thyromegaly.  ?   Vascular: No carotid bruit.  ?Cardiovascular:  ?   Rate and Rhythm: Normal rate and regular rhythm.  ?   Heart sounds: Normal heart sounds.  ?Pulmonary:  ?   Effort: Pulmonary effort is normal.  ?   Breath sounds: Normal breath sounds. No wheezing.  ?Chest:  ?Breasts: ?   Right: No mass.  ?   Left:  No mass.  ?Abdominal:  ?   General: Bowel sounds are normal.  ?   Palpations: Abdomen is soft.  ?   Tenderness: There is no abdominal tenderness.  ?Musculoskeletal:     ?   General: Normal range of motion.  ?   Cervical back: Normal range of motion and neck supple.  ?Lymphadenopathy:  ?   Cervical: No cervical adenopathy.  ?Skin: ?   General: Skin is warm and dry.  ?Neurological:  ?   Mental Status: He is alert and oriented to person, place, and time.  ?   Deep Tendon Reflexes: Reflexes are normal and symmetric.  ?  Psychiatric:     ?   Attention and Perception: Attention normal.     ?   Mood and Affect: Mood normal.     ?   Thought Content: Thought content normal.  ? ? ?Wt Readings from Last 3 Encounters:  ?01/26/22 232 lb 3.2 oz (105.3 kg)  ?05/05/21 235 lb (106.6 kg)  ?01/21/21 235 lb (106.6 kg)  ? ? ?BP 124/62   Pulse 64   Resp 12   Ht 6' (1.829 m)   Wt 232 lb 3.2 oz (105.3 kg)   SpO2 99%   BMI 31.49 kg/m?  ? ?Assessment and Plan: ?1. Annual physical exam ?Normal exam. ?Continue healthy diet and regular exercise (works as a Oceanographer) ?Up to date on screenings and immunizations. ? ?2. Prostate cancer screening ?DRE deferred ?- PSA ? ?3. Mild intermittent asthma without complication ?Well controlled on symbicort.  No need for rescue inhaler but will call if on is needed ?- CBC with Differential/Platelet ?- budesonide-formoterol (SYMBICORT) 160-4.5 MCG/ACT inhaler; INHALE 1 PUFF BY MOUTH TWICE A DAY  Dispense: 30.6 each; Refill: 3 ? ?4. Mixed hyperlipidemia ?Tolerating statin medication without side effects at this time ?Continue same therapy without change at this time. ?- Comprehensive metabolic panel ?- Lipid panel ?- atorvastatin (LIPITOR) 10 MG tablet; Take 1 tablet (10 mg total) by mouth daily.  Dispense: 90 tablet; Refill: 3 ? ?5. Need for diphtheria-tetanus-pertussis (Tdap) vaccine ?- Tdap vaccine greater than or equal to 7yo IM ? ? ?Partially dictated using Editor, commissioning. Any errors are  unintentional. ? ?Halina Maidens, MD ?[facility] ?[facility] ? ?01/26/2022 ? ? ? ? ? ?

## 2022-01-27 LAB — COMPREHENSIVE METABOLIC PANEL
ALT: 23 IU/L (ref 0–44)
AST: 17 IU/L (ref 0–40)
Albumin/Globulin Ratio: 1.8 (ref 1.2–2.2)
Albumin: 4.6 g/dL (ref 3.8–4.8)
Alkaline Phosphatase: 72 IU/L (ref 44–121)
BUN/Creatinine Ratio: 14 (ref 10–24)
BUN: 13 mg/dL (ref 8–27)
Bilirubin Total: 0.6 mg/dL (ref 0.0–1.2)
CO2: 27 mmol/L (ref 20–29)
Calcium: 9.2 mg/dL (ref 8.6–10.2)
Chloride: 105 mmol/L (ref 96–106)
Creatinine, Ser: 0.93 mg/dL (ref 0.76–1.27)
Globulin, Total: 2.5 g/dL (ref 1.5–4.5)
Glucose: 102 mg/dL — ABNORMAL HIGH (ref 70–99)
Potassium: 4.5 mmol/L (ref 3.5–5.2)
Sodium: 143 mmol/L (ref 134–144)
Total Protein: 7.1 g/dL (ref 6.0–8.5)
eGFR: 92 mL/min/{1.73_m2} (ref >59)

## 2022-01-27 LAB — CBC WITH DIFFERENTIAL/PLATELET
Basophils Absolute: 0 10*3/uL (ref 0.0–0.2)
Basos: 1 %
EOS (ABSOLUTE): 0.3 10*3/uL (ref 0.0–0.4)
Eos: 6 %
Hematocrit: 43.2 % (ref 37.5–51.0)
Hemoglobin: 15 g/dL (ref 13.0–17.7)
Immature Grans (Abs): 0 10*3/uL (ref 0.0–0.1)
Immature Granulocytes: 1 %
Lymphocytes Absolute: 1.6 10*3/uL (ref 0.7–3.1)
Lymphs: 38 %
MCH: 33.9 pg — ABNORMAL HIGH (ref 26.6–33.0)
MCHC: 34.7 g/dL (ref 31.5–35.7)
MCV: 98 fL — ABNORMAL HIGH (ref 79–97)
Monocytes Absolute: 0.4 10*3/uL (ref 0.1–0.9)
Monocytes: 10 %
Neutrophils Absolute: 1.9 10*3/uL (ref 1.4–7.0)
Neutrophils: 44 %
Platelets: 181 10*3/uL (ref 150–450)
RBC: 4.43 x10E6/uL (ref 4.14–5.80)
RDW: 12.4 % (ref 11.6–15.4)
WBC: 4.2 10*3/uL (ref 3.4–10.8)

## 2022-01-27 LAB — LIPID PANEL
Chol/HDL Ratio: 3.3 ratio (ref 0.0–5.0)
Cholesterol, Total: 174 mg/dL (ref 100–199)
HDL: 53 mg/dL (ref >39)
LDL Chol Calc (NIH): 107 mg/dL — ABNORMAL HIGH (ref 0–99)
Triglycerides: 76 mg/dL (ref 0–149)
VLDL Cholesterol Cal: 14 mg/dL (ref 5–40)

## 2022-01-27 LAB — PSA: Prostate Specific Ag, Serum: 0.3 ng/mL (ref 0.0–4.0)

## 2022-06-14 DIAGNOSIS — L570 Actinic keratosis: Secondary | ICD-10-CM | POA: Diagnosis not present

## 2022-06-14 DIAGNOSIS — Z872 Personal history of diseases of the skin and subcutaneous tissue: Secondary | ICD-10-CM | POA: Diagnosis not present

## 2022-06-14 DIAGNOSIS — L578 Other skin changes due to chronic exposure to nonionizing radiation: Secondary | ICD-10-CM | POA: Diagnosis not present

## 2022-06-14 DIAGNOSIS — Z85828 Personal history of other malignant neoplasm of skin: Secondary | ICD-10-CM | POA: Diagnosis not present

## 2022-06-14 DIAGNOSIS — Z859 Personal history of malignant neoplasm, unspecified: Secondary | ICD-10-CM | POA: Diagnosis not present

## 2022-06-14 DIAGNOSIS — D485 Neoplasm of uncertain behavior of skin: Secondary | ICD-10-CM | POA: Diagnosis not present

## 2022-06-14 DIAGNOSIS — C44519 Basal cell carcinoma of skin of other part of trunk: Secondary | ICD-10-CM | POA: Diagnosis not present

## 2022-06-14 DIAGNOSIS — D0439 Carcinoma in situ of skin of other parts of face: Secondary | ICD-10-CM | POA: Diagnosis not present

## 2022-07-21 DIAGNOSIS — L988 Other specified disorders of the skin and subcutaneous tissue: Secondary | ICD-10-CM | POA: Diagnosis not present

## 2022-07-21 DIAGNOSIS — C4491 Basal cell carcinoma of skin, unspecified: Secondary | ICD-10-CM | POA: Diagnosis not present

## 2022-08-04 DIAGNOSIS — D0439 Carcinoma in situ of skin of other parts of face: Secondary | ICD-10-CM | POA: Diagnosis not present

## 2022-09-16 DIAGNOSIS — D0439 Carcinoma in situ of skin of other parts of face: Secondary | ICD-10-CM | POA: Diagnosis not present

## 2022-10-21 DIAGNOSIS — H43813 Vitreous degeneration, bilateral: Secondary | ICD-10-CM | POA: Diagnosis not present

## 2022-12-03 DIAGNOSIS — L209 Atopic dermatitis, unspecified: Secondary | ICD-10-CM | POA: Diagnosis not present

## 2022-12-03 DIAGNOSIS — B354 Tinea corporis: Secondary | ICD-10-CM | POA: Diagnosis not present

## 2022-12-03 DIAGNOSIS — J449 Chronic obstructive pulmonary disease, unspecified: Secondary | ICD-10-CM | POA: Diagnosis not present

## 2022-12-03 DIAGNOSIS — E785 Hyperlipidemia, unspecified: Secondary | ICD-10-CM | POA: Diagnosis not present

## 2022-12-03 DIAGNOSIS — J45909 Unspecified asthma, uncomplicated: Secondary | ICD-10-CM | POA: Diagnosis not present

## 2022-12-03 DIAGNOSIS — E669 Obesity, unspecified: Secondary | ICD-10-CM | POA: Diagnosis not present

## 2022-12-15 DIAGNOSIS — Z85828 Personal history of other malignant neoplasm of skin: Secondary | ICD-10-CM | POA: Diagnosis not present

## 2022-12-15 DIAGNOSIS — C44519 Basal cell carcinoma of skin of other part of trunk: Secondary | ICD-10-CM | POA: Diagnosis not present

## 2022-12-15 DIAGNOSIS — L570 Actinic keratosis: Secondary | ICD-10-CM | POA: Diagnosis not present

## 2022-12-15 DIAGNOSIS — D485 Neoplasm of uncertain behavior of skin: Secondary | ICD-10-CM | POA: Diagnosis not present

## 2022-12-15 DIAGNOSIS — Z872 Personal history of diseases of the skin and subcutaneous tissue: Secondary | ICD-10-CM | POA: Diagnosis not present

## 2022-12-15 DIAGNOSIS — L578 Other skin changes due to chronic exposure to nonionizing radiation: Secondary | ICD-10-CM | POA: Diagnosis not present

## 2022-12-15 DIAGNOSIS — Z859 Personal history of malignant neoplasm, unspecified: Secondary | ICD-10-CM | POA: Diagnosis not present

## 2023-01-07 DIAGNOSIS — C4491 Basal cell carcinoma of skin, unspecified: Secondary | ICD-10-CM | POA: Diagnosis not present

## 2023-01-07 DIAGNOSIS — L905 Scar conditions and fibrosis of skin: Secondary | ICD-10-CM | POA: Diagnosis not present

## 2023-01-31 ENCOUNTER — Encounter: Payer: Self-pay | Admitting: Internal Medicine

## 2023-01-31 ENCOUNTER — Ambulatory Visit (INDEPENDENT_AMBULATORY_CARE_PROVIDER_SITE_OTHER): Payer: PPO | Admitting: Internal Medicine

## 2023-01-31 VITALS — BP 122/76 | HR 79 | Ht 72.0 in | Wt 231.0 lb

## 2023-01-31 DIAGNOSIS — Z0000 Encounter for general adult medical examination without abnormal findings: Secondary | ICD-10-CM | POA: Diagnosis not present

## 2023-01-31 DIAGNOSIS — R0602 Shortness of breath: Secondary | ICD-10-CM

## 2023-01-31 DIAGNOSIS — Z85828 Personal history of other malignant neoplasm of skin: Secondary | ICD-10-CM | POA: Diagnosis not present

## 2023-01-31 DIAGNOSIS — Z125 Encounter for screening for malignant neoplasm of prostate: Secondary | ICD-10-CM

## 2023-01-31 DIAGNOSIS — Z23 Encounter for immunization: Secondary | ICD-10-CM

## 2023-01-31 DIAGNOSIS — J4520 Mild intermittent asthma, uncomplicated: Secondary | ICD-10-CM | POA: Diagnosis not present

## 2023-01-31 DIAGNOSIS — E782 Mixed hyperlipidemia: Secondary | ICD-10-CM

## 2023-01-31 MED ORDER — BUDESONIDE-FORMOTEROL FUMARATE 160-4.5 MCG/ACT IN AERO: INHALATION_SPRAY | RESPIRATORY_TRACT | 3 refills | Status: DC

## 2023-01-31 MED ORDER — ATORVASTATIN CALCIUM 10 MG PO TABS: 10.0000 mg | ORAL_TABLET | Freq: Every day | ORAL | 3 refills | Status: DC

## 2023-01-31 NOTE — Assessment & Plan Note (Signed)
Continue regular Dermatology follow up.

## 2023-01-31 NOTE — Progress Notes (Signed)
Date:  01/31/2023   Name:  John Mathews   DOB:  [birth year]   MRN:  [id number]   Chief Complaint: Annual Exam John Mathews is a 67 y.o. male who presents today for his Complete Annual Exam. He feels well. He reports exercising. He reports he is sleeping fairly well.   Colonoscopy: 09/2015 repeat 10 yr  Immunization History  Administered Date(s) Administered   COVID-19, mRNA, vaccine(Comirnaty)12 years and older 08/13/2022   Fluad Quad(high Dose 65+) 08/13/2022   Influenza,inj,Quad PF,6+ Mos 07/24/2018, 08/17/2019, 09/12/2021   Influenza-Unspecified 07/19/2015, 08/27/2017, 08/01/2020   Moderna SARS-COV2 Booster Vaccination 09/29/2020   PNEUMOCOCCAL CONJUGATE-20 01/31/2023   Pneumococcal Polysaccharide-23 01/12/2011   Tdap 04/20/2011, 01/26/2022   Zoster Recombinat (Shingrix) 01/19/2019, 04/23/2019   Zoster, Live 01/24/2014   Health Maintenance Due  Topic Date Due   Medicare Annual Wellness (AWV)  Never done   HIV Screening  Never done   COVID-19 Vaccine (2 - Pfizer risk series) 09/03/2022    Lab Results  Component Value Date   PSA1 0.3 01/26/2022   PSA1 0.4 01/21/2021   PSA1 0.4 01/21/2020    Asthma He complains of cough, shortness of breath and wheezing. This is a recurrent problem. The problem occurs rarely. The problem has been unchanged. Pertinent negatives include no appetite change, chest pain, headaches, myalgias or trouble swallowing. His symptoms are alleviated by beta-agonist and steroid inhaler. He reports significant improvement on treatment. His past medical history is significant for asthma.  Hyperlipidemia This is a chronic problem. The problem is controlled. Associated symptoms include shortness of breath. Pertinent negatives include no chest pain or myalgias. Current antihyperlipidemic treatment includes statins. The current treatment provides significant improvement of lipids.  Shortness of Breath This is a new problem. The current episode  started more than 1 month ago. Associated symptoms include wheezing. Pertinent negatives include no abdominal pain, chest pain, headaches, leg swelling, orthopnea or rash. The symptoms are aggravated by exercise. He has tried nothing for the symptoms. His past medical history is significant for asthma.  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  He stop occasionally to get his breath but generally can continue at a slower pace.  These symptoms seem different than shortness of breath related to asthma.  Lab Results  Component Value Date   NA 143 01/26/2022   K 4.5 01/26/2022   CO2 27 01/26/2022   GLUCOSE 102 (H) 01/26/2022   BUN 13 01/26/2022   CREATININE 0.93 01/26/2022   CALCIUM 9.2 01/26/2022   EGFR 92 01/26/2022   GFRNONAA >60 08/26/2020   Lab Results  Component Value Date   CHOL 174 01/26/2022   HDL 53 01/26/2022   LDLCALC 107 (H) 01/26/2022   TRIG 76 01/26/2022   CHOLHDL 3.3 01/26/2022   Lab Results  Component Value Date   TSH 0.762 08/18/2015   No results found for: "HGBA1C" Lab Results  Component Value Date   WBC 4.2 01/26/2022   HGB 15.0 01/26/2022   HCT 43.2 01/26/2022   MCV 98 (H) 01/26/2022   PLT 181 01/26/2022   Lab Results  Component Value Date   ALT 23 01/26/2022   AST 17 01/26/2022   ALKPHOS 72 01/26/2022   BILITOT 0.6 01/26/2022   No results found for: "25OHVITD2", "25OHVITD3", "VD25OH"   Review of Systems  Constitutional:  Negative for appetite change, chills, diaphoresis, fatigue and unexpected weight change.  HENT:  Negative for hearing loss, tinnitus, trouble swallowing and voice change.   Eyes:  Negative for visual disturbance.  Respiratory:  Positive for cough, shortness of breath and wheezing. Negative for choking.   Cardiovascular:  Negative for chest pain, palpitations, orthopnea and leg swelling.  Gastrointestinal:  Negative for abdominal pain, blood in stool, constipation and  diarrhea.  Genitourinary:  Negative for difficulty urinating, dysuria and frequency.  Musculoskeletal:  Negative for arthralgias, back pain and myalgias.  Skin:  Negative for color change and rash.  Allergic/Immunologic: Positive for environmental allergies.  Neurological:  Negative for dizziness, syncope and headaches.  Hematological:  Negative for adenopathy.  Psychiatric/Behavioral:  Negative for dysphoric mood and sleep disturbance. The patient is not nervous/anxious.     Patient Active Problem List   Diagnosis Date Noted   Hx of squamous cell carcinoma of skin 01/21/2021   Plantar fasciitis of right foot 01/18/2018   Gastroesophageal reflux disease 01/04/2017   Mild mitral regurgitation 10/20/2016   Mixed hyperlipidemia 08/19/2016   Mild intermittent asthma without complication [id number]   Tubular adenoma of colon 05/21/2015   Dermatitis of multiple sites 05/21/2015    Allergies  Allergen Reactions   Diphenhydramine Hcl Other (See Comments)    hyperactivity    Past Surgical History:  Procedure Laterality Date   BASAL CELL CARCINOMA EXCISION     COLONOSCOPY  2012   repeat in 3 years    Social History   Tobacco Use   Smoking status: Never   Smokeless tobacco: Never  Substance Use Topics   Alcohol use: Yes    Alcohol/week: 0.0 standard drinks of alcohol    Comment: occasional   Drug use: No     Medication list has been reviewed and updated.  Current Meds  Medication Sig   fluorouracil (EFUDEX) 5 % cream Apply topically 2 (two) times daily.   loratadine (CLARITIN) 10 MG tablet Take 10 mg by mouth daily.   [DISCONTINUED] atorvastatin (LIPITOR) 10 MG tablet Take 1 tablet (10 mg total) by mouth daily.   [DISCONTINUED] budesonide-formoterol (SYMBICORT) 160-4.5 MCG/ACT inhaler INHALE 1 PUFF BY MOUTH TWICE A DAY       01/31/2023    8:25 AM 01/26/2022    8:38 AM 05/05/2021   11:02 AM 01/21/2021    8:41 AM  GAD 7 : Generalized Anxiety Score  Nervous, Anxious, on  Edge 0 0 0 0  Control/stop worrying 0 0 0 0  Worry too much - different things 1 0 0 0  Trouble relaxing 0 0 0 0  Restless 0 0 0 0  Easily annoyed or irritable 1 0 0 0  Afraid - awful might happen 0 0 0 0  Total GAD 7 Score 2 0 0 0  Anxiety Difficulty Not difficult at all Not difficult at all Not difficult at all        01/31/2023    8:25 AM 01/26/2022    8:38 AM 05/05/2021   11:02 AM  Depression screen PHQ 2/9  Decreased Interest 0 0 0  Down, Depressed, Hopeless 0 0 0  PHQ - 2 Score 0 0 0  Altered sleeping 1 0 0  Tired, decreased energy 1 0 0  Change in appetite 1 0 0  Feeling bad or failure about yourself  0 0 0  Trouble concentrating 0 0 0  Moving slowly or fidgety/restless 0 0 0  Suicidal thoughts 0 0 0  PHQ-9 Score 3 0 0  Difficult doing work/chores Somewhat difficult Not  difficult at all Not difficult at all    BP Readings from Last 3 Encounters:  01/31/23 122/76  01/26/22 124/62  05/05/21 118/60    Physical Exam Vitals and nursing note reviewed.  Constitutional:      Appearance: Normal appearance. He is well-developed.  HENT:     Head: Normocephalic.     Right Ear: Tympanic membrane, ear canal and external ear normal.     Left Ear: Tympanic membrane, ear canal and external ear normal.     Nose: Nose normal.  Eyes:     Conjunctiva/sclera: Conjunctivae normal.     Pupils: Pupils are equal, round, and reactive to light.  Neck:     Thyroid: No thyromegaly.     Vascular: No carotid bruit.  Cardiovascular:     Rate and Rhythm: Normal rate and regular rhythm.     Heart sounds: Normal heart sounds.  Pulmonary:     Effort: Pulmonary effort is normal.     Breath sounds: Normal breath sounds. No wheezing.  Chest:  Breasts:    Right: No mass.     Left: No mass.  Abdominal:     General: Bowel sounds are normal.     Palpations: Abdomen is soft.     Tenderness: There is no abdominal tenderness.  Musculoskeletal:        General: Normal range of motion.      Cervical back: Normal range of motion and neck supple.  Lymphadenopathy:     Cervical: No cervical adenopathy.  Skin:    General: Skin is warm and dry.  Neurological:     Mental Status: He is alert and oriented to person, place, and time.     Deep Tendon Reflexes: Reflexes are normal and symmetric.  Psychiatric:        Attention and Perception: Attention normal.        Mood and Affect: Mood normal.        Thought Content: Thought content normal.     Wt Readings from Last 3 Encounters:  01/31/23 231 lb (104.8 kg)  01/26/22 232 lb 3.2 oz (105.3 kg)  05/05/21 235 lb (106.6 kg)    BP 122/76   Pulse 79   Ht 6' (1.829 m)   Wt 231 lb (104.8 kg)   SpO2 96%   BMI 31.33 kg/m   Assessment and Plan:  Problem List Items Addressed This Visit       Respiratory   Mild intermittent asthma without complication    Asthma well controlled on Symbicort. Due for Prevnar-20      Relevant Medications   budesonide-formoterol (SYMBICORT) 160-4.5 MCG/ACT inhaler   Other Relevant Orders   CBC with Differential/Platelet   Pneumococcal conjugate vaccine 20-valent (Completed)     Other   Hx of squamous cell carcinoma of skin    Continue regular Dermatology follow up.      Mixed hyperlipidemia    Tolerating statin medications without concerns LDL is  Lab Results  Component Value Date   LDLCALC 107 (H) 01/26/2022  Current dose will be adjusted if needed.       Relevant Medications   atorvastatin (LIPITOR) 10 MG tablet   Other Relevant Orders   Comprehensive metabolic panel   Lipid panel   Other Visit Diagnoses     Annual physical exam    -  Primary   Relevant Orders   CBC with Differential/Platelet   Comprehensive metabolic panel   Lipid panel   PSA   Prostate cancer screening  Relevant Orders   PSA   Need for vaccination for pneumococcus       Shortness of breath       EKG is normal. will refer to Cardiology for stress testing Begin ASA 81 mg daily   Relevant  Orders   EKG 12-Lead (Completed)   Ambulatory referral to Cardiology       Return in about 1 year (around 01/31/2024) for CPX.   Partially dictated using [place], any errors are not intentional.  Glean Hess, MD [place], [place]

## 2023-01-31 NOTE — Assessment & Plan Note (Signed)
Asthma well controlled on Symbicort. Due for Prevnar-20

## 2023-01-31 NOTE — Assessment & Plan Note (Signed)
Tolerating statin medications without concerns LDL is  Lab Results  Component Value Date   LDLCALC 107 (H) 01/26/2022   Current dose will be adjusted if needed.

## 2023-01-31 NOTE — Patient Instructions (Signed)
Begin ASA 81 mg coated daily.

## 2023-02-01 LAB — CBC WITH DIFFERENTIAL/PLATELET
Basophils Absolute: 0 10*3/uL (ref 0.0–0.2)
Basos: 1 %
EOS (ABSOLUTE): 0.2 10*3/uL (ref 0.0–0.4)
Eos: 5 %
Hematocrit: 44.9 % (ref 37.5–51.0)
Hemoglobin: 15.8 g/dL (ref 13.0–17.7)
Immature Grans (Abs): 0 10*3/uL (ref 0.0–0.1)
Immature Granulocytes: 1 %
Lymphocytes Absolute: 1.3 10*3/uL (ref 0.7–3.1)
Lymphs: 31 %
MCH: 34.6 pg — ABNORMAL HIGH (ref 26.6–33.0)
MCHC: 35.2 g/dL (ref 31.5–35.7)
MCV: 98 fL — ABNORMAL HIGH (ref 79–97)
Monocytes Absolute: 0.4 10*3/uL (ref 0.1–0.9)
Monocytes: 9 %
Neutrophils Absolute: 2.2 10*3/uL (ref 1.4–7.0)
Neutrophils: 53 %
Platelets: 171 10*3/uL (ref 150–450)
RBC: 4.57 x10E6/uL (ref 4.14–5.80)
RDW: 12.5 % (ref 11.6–15.4)
WBC: 4.1 10*3/uL (ref 3.4–10.8)

## 2023-02-01 LAB — COMPREHENSIVE METABOLIC PANEL
ALT: 22 IU/L (ref 0–44)
AST: 17 IU/L (ref 0–40)
Albumin/Globulin Ratio: 1.6 (ref 1.2–2.2)
Albumin: 4.2 g/dL (ref 3.9–4.9)
Alkaline Phosphatase: 70 IU/L (ref 44–121)
BUN/Creatinine Ratio: 14 (ref 10–24)
BUN: 15 mg/dL (ref 8–27)
Bilirubin Total: 0.5 mg/dL (ref 0.0–1.2)
CO2: 24 mmol/L (ref 20–29)
Calcium: 9.3 mg/dL (ref 8.6–10.2)
Chloride: 105 mmol/L (ref 96–106)
Creatinine, Ser: 1.08 mg/dL (ref 0.76–1.27)
Globulin, Total: 2.6 g/dL (ref 1.5–4.5)
Glucose: 110 mg/dL — ABNORMAL HIGH (ref 70–99)
Potassium: 4.7 mmol/L (ref 3.5–5.2)
Sodium: 145 mmol/L — ABNORMAL HIGH (ref 134–144)
Total Protein: 6.8 g/dL (ref 6.0–8.5)
eGFR: 76 mL/min/{1.73_m2} (ref >59)

## 2023-02-01 LAB — PSA: Prostate Specific Ag, Serum: 0.4 ng/mL (ref 0.0–4.0)

## 2023-02-01 LAB — LIPID PANEL
Chol/HDL Ratio: 3.5 ratio (ref 0.0–5.0)
Cholesterol, Total: 190 mg/dL (ref 100–199)
HDL: 54 mg/dL (ref >39)
LDL Chol Calc (NIH): 121 mg/dL — ABNORMAL HIGH (ref 0–99)
Triglycerides: 84 mg/dL (ref 0–149)
VLDL Cholesterol Cal: 15 mg/dL (ref 5–40)

## 2023-02-22 NOTE — Progress Notes (Signed)
Cardiology Office Note:    Date:  02/28/2023   ID:  John Mathews, DOB 11-Oct-1956, MRN [id number]  PCP:  Reubin Milan, MD   [facility] Providers Cardiologist:  None     Referring MD: Reubin Milan, MD   Chief Complaint  Patient presents with   Shortness of Breath    History of Present Illness:    John Mathews is a 67 y.o. male seen at the request of Dr Judithann Graves for evaluation of SOB. He has a history of HLD and asthma. Echo in 2016 was unremarkable. 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 He does have asthma but states his asthma has never bothered him in this way. No chest pain or pressure. No palpitations. Relieved with rest after a few minutes. Notes it got worse in March but since first of April symptoms have improved.   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 Swallow whole.   atorvastatin (LIPITOR) 10 MG tablet Take 1 tablet (10 mg total) by mouth daily.   budesonide-formoterol (SYMBICORT) 160-4.5 MCG/ACT inhaler INHALE 1 PUFF BY MOUTH TWICE A DAY   fluorouracil (EFUDEX) 5 % cream Apply topically 2 (two) times daily.   loratadine (CLARITIN) 10 MG tablet Take 10 mg by mouth daily.     Allergies:   Diphenhydramine hcl   Social History   Socioeconomic History   Marital status: Married    Spouse name: Not on file   Number of children: 0   Years of education: Not on file   Highest education level: Not on file  Occupational History   Not on file  Tobacco Use   Smoking status: Never   Smokeless tobacco: Never   Substance and Sexual Activity   Alcohol use: Yes    Alcohol/week: 0.0 standard drinks of alcohol    Comment: occasional   Drug use: No   Sexual activity: Yes    Partners: Female  Other Topics Concern   Not on file  Social History Narrative   Land surveying         Social Determinants of Health   Financial Resource Strain: Not on file  Food Insecurity: Not on file  Transportation Needs: Not on file  Physical Activity: Not on file  Stress: Not on file  Social Connections: Not on file     Family History: The patient's family history includes Breast cancer in his sister; Dementia in his father; Schizophrenia in his mother.  ROS:   Please see the history of present illness.     All other systems reviewed and are negative.  EKGs/Labs/Other Studies Reviewed:    The following studies were reviewed today: Echo 06/04/15: Study Conclusions   - Left ventricle: The cavity size was normal. Systolic function was    normal. The estimated ejection fraction was in the range of 60%    to 65%. Wall motion was normal; there were no regional wall    motion  abnormalities. Left ventricular diastolic function    parameters were normal.  - Aortic valve: There was trivial regurgitation.  - Mitral valve: There was mild regurgitation.  - Left atrium: The atrium was mildly dilated.  - Right ventricle: Systolic function was normal.  - Pulmonary arteries: Systolic pressure was within the normal    range.   Impressions:   - No valve vegetation noted.   EKG:  EKG is not  ordered today.  The ekg ordered 01/31/23 demonstrates NSR rate 69. Normal. I have personally reviewed and interpreted this study.   Recent Labs: 01/31/2023: ALT 22; BUN 15; Creatinine, Ser 1.08; Hemoglobin 15.8; Platelets 171; Potassium 4.7; Sodium 145  Recent Lipid Panel    Component Value Date/Time   CHOL 190 01/31/2023 0927   TRIG 84 01/31/2023 0927   HDL 54 01/31/2023 0927   CHOLHDL 3.5 01/31/2023 0927   LDLCALC 121 (H)  01/31/2023 0927     Risk Assessment/Calculations:            Physical Exam:    VS:  BP (!) 140/88   Pulse 76   Ht 6' (1.829 m)   Wt 231 lb 9.6 oz (105.1 kg)   SpO2 97%   BMI 31.41 kg/m     Wt Readings from Last 3 Encounters:  02/28/23 231 lb 9.6 oz (105.1 kg)  01/31/23 231 lb (104.8 kg)  01/26/22 232 lb 3.2 oz (105.3 kg)     GEN:  Well nourished, well developed in no acute distress HEENT: Normal NECK: No JVD; No carotid bruits LYMPHATICS: No lymphadenopathy CARDIAC: RRR, no murmurs, rubs, gallops RESPIRATORY:  Clear to auscultation without rales, wheezing or rhonchi  ABDOMEN: Soft, non-tender, non-distended MUSCULOSKELETAL:  No edema; No deformity  SKIN: Warm and dry NEUROLOGIC:  Alert and oriented x 3 PSYCHIATRIC:  Normal affect   ASSESSMENT:    1. Dyspnea on exertion   2. Mixed hyperlipidemia   3. Mild intermittent asthma without complication    PLAN:    In order of problems listed above:  Dyspnea on exertion. May be related to asthma but patient concerned that this is different. Risk factor of HLD. Will arrange Echo to assess LV function and also arrange POET to evaluate for ischemia. Fortunately symptoms are better now so if above testing is normal can observe for now.       Shared Decision Making/Informed Consent The risks [chest pain, shortness of breath, cardiac arrhythmias, dizziness, blood pressure fluctuations, myocardial infarction, stroke/transient ischemic attack, and life-threatening complications (estimated to be 1 in 10,000)], benefits (risk stratification, diagnosing coronary artery disease, treatment guidance) and alternatives of an exercise tolerance test were discussed in detail with John Mathews and he agrees to proceed.    Medication Adjustments/Labs and Tests Ordered: Current medicines are reviewed at length with the patient today.  Concerns regarding medicines are outlined above.  No orders of the defined types were placed in this  encounter.  No orders of the defined types were placed in this encounter.   There are no Patient Instructions on file for this visit.   Signed, Gwyneth Fernandez [place], MD  02/28/2023 9:54 AM    [facility]

## 2023-02-28 ENCOUNTER — Ambulatory Visit: Payer: PPO | Attending: Cardiology | Admitting: Cardiology

## 2023-02-28 ENCOUNTER — Encounter: Payer: Self-pay | Admitting: Cardiology

## 2023-02-28 VITALS — BP 140/88 | HR 76 | Ht 72.0 in | Wt 231.6 lb

## 2023-02-28 DIAGNOSIS — E782 Mixed hyperlipidemia: Secondary | ICD-10-CM

## 2023-02-28 DIAGNOSIS — J4520 Mild intermittent asthma, uncomplicated: Secondary | ICD-10-CM

## 2023-02-28 DIAGNOSIS — R0609 Other forms of dyspnea: Secondary | ICD-10-CM

## 2023-02-28 NOTE — Patient Instructions (Signed)
Medication Instructions:  Continue same medications   Lab Work: None ordered   Testing/Procedures: Echo Treadmill   Follow-Up: At [facility], you and your health needs are our priority.  As part of our continuing mission to provide you with exceptional heart care, we have created designated Provider Care Teams.  These Care Teams include your primary Cardiologist (physician) and Advanced Practice Providers (APPs -  Physician Assistants and Nurse Practitioners) who all work together to provide you with the care you need, when you need it.  We recommend signing up for the patient portal called "MyChart".  Sign up information is provided on this After Visit Summary.  MyChart is used to connect with patients for Virtual Visits (Telemedicine).  Patients are able to view lab/test results, encounter notes, upcoming appointments, etc.  Non-urgent messages can be sent to your provider as well.   To learn more about what you can do with MyChart, go to [email or web address].    Your next appointment:  To Be Determined    Provider:  DR.Jordan

## 2023-03-02 ENCOUNTER — Other Ambulatory Visit (HOSPITAL_COMMUNITY): Payer: Self-pay | Admitting: Cardiology

## 2023-03-02 DIAGNOSIS — E782 Mixed hyperlipidemia: Secondary | ICD-10-CM

## 2023-03-02 DIAGNOSIS — R0609 Other forms of dyspnea: Secondary | ICD-10-CM

## 2023-03-10 ENCOUNTER — Telehealth (HOSPITAL_COMMUNITY): Payer: Self-pay | Admitting: *Deleted

## 2023-03-10 NOTE — Telephone Encounter (Signed)
Spoke with patient and he was given detailed instructions about his ETT on 03/11/23 at 3:30.

## 2023-03-11 ENCOUNTER — Ambulatory Visit (HOSPITAL_COMMUNITY): Payer: PPO | Attending: Cardiology

## 2023-03-11 ENCOUNTER — Ambulatory Visit (HOSPITAL_COMMUNITY): Payer: PPO

## 2023-03-11 DIAGNOSIS — E782 Mixed hyperlipidemia: Secondary | ICD-10-CM | POA: Insufficient documentation

## 2023-03-11 DIAGNOSIS — R0609 Other forms of dyspnea: Secondary | ICD-10-CM | POA: Diagnosis not present

## 2023-03-11 LAB — EXERCISE TOLERANCE TEST
Estimated workload: 8.5
Exercise duration (min): 7 min
Exercise duration (sec): 1 s
MPHR: 100 {beats}/min
Peak HR: 155 {beats}/min
Rest HR: 77 {beats}/min

## 2023-03-15 ENCOUNTER — Telehealth: Payer: Self-pay

## 2023-03-15 ENCOUNTER — Other Ambulatory Visit: Payer: Self-pay

## 2023-03-15 ENCOUNTER — Encounter: Payer: Self-pay | Admitting: Internal Medicine

## 2023-03-15 DIAGNOSIS — Z01812 Encounter for preprocedural laboratory examination: Secondary | ICD-10-CM

## 2023-03-15 DIAGNOSIS — R0609 Other forms of dyspnea: Secondary | ICD-10-CM

## 2023-03-15 MED ORDER — AMLODIPINE BESYLATE 5 MG PO TABS: 5.0000 mg | ORAL_TABLET | Freq: Every day | ORAL | 3 refills | Status: DC

## 2023-03-15 MED ORDER — METOPROLOL TARTRATE 100 MG PO TABS: ORAL_TABLET | ORAL | 0 refills | Status: DC

## 2023-03-15 NOTE — Patient Instructions (Addendum)
   Your cardiac CT will be scheduled at one of the below locations:   [facility] [address] [place], [place] [address] [phone]  OR  [facility] [address] [place], [place] [address] [phone]  OR   [facility] [address] [place], [place] [address] [phone]  If scheduled at [facility], please arrive at the Women's and Children's Entrance (Entrance C2) of [facility] 30 minutes prior to test start time. You can use the FREE valet parking offered at entrance C (encouraged to control the heart rate for the test)  Proceed to the [place] [facility] Department (first floor) to check-in and test prep.  All radiology patients and guests should use entrance C2 at [facility], accessed from [address], even though the hospital's physical address listed is [address].    If scheduled at [facility] or [facility], please arrive 15 mins early for check-in and test prep.   Please follow these instructions carefully (unless otherwise directed):  Hold all erectile dysfunction medications at least 3 days (72 hrs) prior to test. (Ie viagra, cialis, sildenafil, tadalafil, etc) We will administer nitroglycerin during this exam.   On the Night Before the Test: Be sure to Drink plenty of water. Do not consume any caffeinated/decaffeinated beverages or chocolate 12 hours prior to your test. Do not take any antihistamines 12 hours prior to your test.   On the Day of the Test: Drink plenty of water until 1 hour prior to the test. Do not eat any food 1 hour prior to test. You may take your regular medications prior to the test.  Take metoprolol 100 mg two hours prior to test.          After the Test: Drink plenty of water. After receiving IV contrast, you may experience a mild  flushed feeling. This is normal. On occasion, you may experience a mild rash up to 24 hours after the test. This is not dangerous. If this occurs, you can take Benadryl 25 mg and increase your fluid intake. If you experience trouble breathing, this can be serious. If it is severe call 911 IMMEDIATELY. If it is mild, please call our office.   We will call to schedule your test 2-4 weeks out understanding that some insurance companies will need an authorization prior to the service being performed.   For non-scheduling related questions, please contact the cardiac imaging nurse navigator should you have any questions/concerns: Sara Wallace, Cardiac Imaging Nurse Navigator Merle Prescott, Cardiac Imaging Nurse Navigator  [facility] Services Direct Office Dial: [phone]   For scheduling needs, including cancellations and rescheduling, please call Brittany, [phone].  

## 2023-03-15 NOTE — Telephone Encounter (Signed)
   Your cardiac CT will be scheduled at one of the below locations:   [facility] [address] [place], [place] [address] [phone]  OR  [facility] [address] [place], [place] [address] [phone]  OR   [facility] [address] [place], [place] [address] [phone]  If scheduled at [facility], please arrive at the Women's and Children's Entrance (Entrance C2) of [facility] 30 minutes prior to test start time. You can use the FREE valet parking offered at entrance C (encouraged to control the heart rate for the test)  Proceed to the [place] [facility] Department (first floor) to check-in and test prep.  All radiology patients and guests should use entrance C2 at [facility], accessed from [address], even though the hospital's physical address listed is [address].    If scheduled at [facility] or [facility], please arrive 15 mins early for check-in and test prep.   Please follow these instructions carefully (unless otherwise directed):  Hold all erectile dysfunction medications at least 3 days (72 hrs) prior to test. (Ie viagra, cialis, sildenafil, tadalafil, etc) We will administer nitroglycerin during this exam.   On the Night Before the Test: Be sure to Drink plenty of water. Do not consume any caffeinated/decaffeinated beverages or chocolate 12 hours prior to your test. Do not take any antihistamines 12 hours prior to your test.   On the Day of the Test: Drink plenty of water until 1 hour prior to the test. Do not eat any food 1 hour prior to test. You may take your regular medications prior to the test.  Take metoprolol 100 mg two hours prior to test.          After the Test: Drink plenty of water. After receiving IV contrast, you may experience a mild  flushed feeling. This is normal. On occasion, you may experience a mild rash up to 24 hours after the test. This is not dangerous. If this occurs, you can take Benadryl 25 mg and increase your fluid intake. If you experience trouble breathing, this can be serious. If it is severe call 911 IMMEDIATELY. If it is mild, please call our office.   We will call to schedule your test 2-4 weeks out understanding that some insurance companies will need an authorization prior to the service being performed.   For non-scheduling related questions, please contact the cardiac imaging nurse navigator should you have any questions/concerns: Sara Wallace, Cardiac Imaging Nurse Navigator Merle Prescott, Cardiac Imaging Nurse Navigator [facility] Direct Office Dial: [phone]   For scheduling needs, including cancellations and rescheduling, please call Brittany, [phone].  

## 2023-03-16 DIAGNOSIS — R0609 Other forms of dyspnea: Secondary | ICD-10-CM | POA: Diagnosis not present

## 2023-03-16 DIAGNOSIS — Z01812 Encounter for preprocedural laboratory examination: Secondary | ICD-10-CM | POA: Diagnosis not present

## 2023-03-16 LAB — BASIC METABOLIC PANEL
BUN/Creatinine Ratio: 16 (ref 10–24)
BUN: 15 mg/dL (ref 8–27)
CO2: 25 mmol/L (ref 20–29)
Calcium: 8.9 mg/dL (ref 8.6–10.2)
Chloride: 105 mmol/L (ref 96–106)
Creatinine, Ser: 0.92 mg/dL (ref 0.76–1.27)
Glucose: 105 mg/dL — ABNORMAL HIGH (ref 70–99)
Potassium: 4.7 mmol/L (ref 3.5–5.2)
Sodium: 141 mmol/L (ref 134–144)
eGFR: 92 mL/min/{1.73_m2} (ref >59)

## 2023-03-25 ENCOUNTER — Telehealth (HOSPITAL_COMMUNITY): Payer: Self-pay | Admitting: *Deleted

## 2023-03-25 ENCOUNTER — Ambulatory Visit (HOSPITAL_COMMUNITY): Payer: PPO | Attending: Cardiology

## 2023-03-25 DIAGNOSIS — E782 Mixed hyperlipidemia: Secondary | ICD-10-CM | POA: Insufficient documentation

## 2023-03-25 DIAGNOSIS — J4520 Mild intermittent asthma, uncomplicated: Secondary | ICD-10-CM | POA: Diagnosis not present

## 2023-03-25 DIAGNOSIS — R0609 Other forms of dyspnea: Secondary | ICD-10-CM | POA: Insufficient documentation

## 2023-03-25 LAB — ECHOCARDIOGRAM COMPLETE
Area-P 1/2: 3.5 cm2
P 1/2 time: 756 msec
S' Lateral: 3.4 cm

## 2023-03-25 NOTE — Telephone Encounter (Signed)
Attempted to call patient regarding upcoming cardiac CT appointment. °Left message on voicemail with name and callback number ° °Joslynne Klatt RN Navigator Cardiac Imaging °[facility] °[phone] Office °[phone] Cell ° °

## 2023-03-25 NOTE — Telephone Encounter (Signed)
Reaching out to patient to offer assistance regarding upcoming cardiac imaging study; pt verbalizes understanding of appt date/time, parking situation and where to check in, pre-test NPO status and medications ordered, and verified current allergies; name and call back number provided for further questions should they arise ? ?Pesach Frisch RN Navigator Cardiac Imaging ?[facility] ?[phone] office ?[phone] cell ? ?Patient to take 100mg metoprolol tartrate two hours prior to his cardiac CT scan.  He is aware to arrive at 10:30am. ?

## 2023-03-29 ENCOUNTER — Ambulatory Visit (HOSPITAL_COMMUNITY)
Admission: RE | Admit: 2023-03-29 | Discharge: 2023-03-29 | Disposition: A | Discharge status: Home / Self Care | Payer: PPO | Source: Ambulatory Visit | Attending: Cardiology | Admitting: Cardiology

## 2023-03-29 DIAGNOSIS — E782 Mixed hyperlipidemia: Secondary | ICD-10-CM | POA: Diagnosis not present

## 2023-03-29 DIAGNOSIS — R0609 Other forms of dyspnea: Secondary | ICD-10-CM | POA: Insufficient documentation

## 2023-03-29 DIAGNOSIS — I2510 Atherosclerotic heart disease of native coronary artery without angina pectoris: Secondary | ICD-10-CM | POA: Diagnosis not present

## 2023-03-29 MED ORDER — NITROGLYCERIN 0.4 MG SL SUBL
SUBLINGUAL_TABLET | SUBLINGUAL | Status: AC
  Filled 2023-03-29: qty 2

## 2023-03-29 MED ORDER — IOHEXOL 350 MG/ML SOLN
95.0000 mL | Freq: Once | INTRAVENOUS | Status: AC | PRN
  Administered 2023-03-29: 95 mL via INTRAVENOUS

## 2023-03-29 MED ORDER — NITROGLYCERIN 0.4 MG SL SUBL
0.8000 mg | SUBLINGUAL_TABLET | Freq: Once | SUBLINGUAL | Status: AC
  Administered 2023-03-29: 0.8 mg via SUBLINGUAL

## 2023-03-29 NOTE — Progress Notes (Signed)
Patient tolerated CT well.Vital signs stable encourage to drink water throughout day.Reasons explained and verbalized understanding. Ambulated steady gait.   

## 2023-03-31 ENCOUNTER — Other Ambulatory Visit: Payer: Self-pay

## 2023-03-31 DIAGNOSIS — E782 Mixed hyperlipidemia: Secondary | ICD-10-CM

## 2023-03-31 MED ORDER — ROSUVASTATIN CALCIUM 20 MG PO TABS: 20.0000 mg | ORAL_TABLET | Freq: Every day | ORAL | 3 refills | Status: DC

## 2023-04-01 ENCOUNTER — Encounter: Payer: Self-pay | Admitting: Internal Medicine

## 2023-04-01 ENCOUNTER — Other Ambulatory Visit: Payer: Self-pay

## 2023-04-01 DIAGNOSIS — I2510 Atherosclerotic heart disease of native coronary artery without angina pectoris: Secondary | ICD-10-CM | POA: Insufficient documentation

## 2023-04-01 DIAGNOSIS — E782 Mixed hyperlipidemia: Secondary | ICD-10-CM

## 2023-05-12 ENCOUNTER — Telehealth: Payer: Self-pay

## 2023-05-12 DIAGNOSIS — E782 Mixed hyperlipidemia: Secondary | ICD-10-CM

## 2023-05-12 DIAGNOSIS — R931 Abnormal findings on diagnostic imaging of heart and coronary circulation: Secondary | ICD-10-CM

## 2023-05-12 NOTE — Telephone Encounter (Signed)
Spoke to patient Dr.Jordan advised needs appointment with Pharm D in Lipid Clinic since you are unable to take a statin.Lipid Clinic appointment scheduled 8/9 at 2:15 pm.

## 2023-05-13 DIAGNOSIS — D0439 Carcinoma in situ of skin of other parts of face: Secondary | ICD-10-CM | POA: Diagnosis not present

## 2023-06-03 DIAGNOSIS — L570 Actinic keratosis: Secondary | ICD-10-CM | POA: Diagnosis not present

## 2023-06-03 DIAGNOSIS — D0439 Carcinoma in situ of skin of other parts of face: Secondary | ICD-10-CM | POA: Diagnosis not present

## 2023-06-03 DIAGNOSIS — Z85828 Personal history of other malignant neoplasm of skin: Secondary | ICD-10-CM | POA: Diagnosis not present

## 2023-06-03 DIAGNOSIS — Z872 Personal history of diseases of the skin and subcutaneous tissue: Secondary | ICD-10-CM | POA: Diagnosis not present

## 2023-06-03 DIAGNOSIS — L578 Other skin changes due to chronic exposure to nonionizing radiation: Secondary | ICD-10-CM | POA: Diagnosis not present

## 2023-06-03 DIAGNOSIS — D485 Neoplasm of uncertain behavior of skin: Secondary | ICD-10-CM | POA: Diagnosis not present

## 2023-06-03 DIAGNOSIS — Z859 Personal history of malignant neoplasm, unspecified: Secondary | ICD-10-CM | POA: Diagnosis not present

## 2023-06-10 ENCOUNTER — Telehealth: Payer: Self-pay | Admitting: Pharmacist Clinician (PhC)/ Clinical Pharmacy Specialist

## 2023-06-10 ENCOUNTER — Ambulatory Visit: Payer: PPO | Attending: Cardiology | Admitting: Pharmacist Clinician (PhC)/ Clinical Pharmacy Specialist

## 2023-06-10 ENCOUNTER — Encounter: Payer: Self-pay | Admitting: Pharmacist Clinician (PhC)/ Clinical Pharmacy Specialist

## 2023-06-10 DIAGNOSIS — E782 Mixed hyperlipidemia: Secondary | ICD-10-CM | POA: Diagnosis not present

## 2023-06-10 NOTE — Patient Instructions (Signed)
Your Results:             Your most recent labs Goal  Total Cholesterol 190 < 200  Triglycerides 84 < 150  HDL (happy/good cholesterol) 54 > 40  LDL (lousy/bad cholesterol 121 < 70   Medication changes:  We will start the process to get Repatha covered by your insurance.  Once the prior authorization is complete, I will call/send a MyChart message to let you know and confirm pharmacy information.   You will take one injection every 14 days.   Lab orders:  We want to repeat labs after 2-3 months.  We will send you a lab order to remind you once we get closer to that time.      Thank you for choosing [facility]

## 2023-06-10 NOTE — Telephone Encounter (Signed)
Please do Repatha PA

## 2023-06-10 NOTE — Assessment & Plan Note (Signed)
Assessment: Patient with ASCVD not at LDL goal of < 70 Most recent LDL 121 on 01/31/23 - prior to trial of rosuvastatin Not able to tolerate statins secondary to myalgias - rosuvastatin, atorvastatin Reviewed options for lowering LDL cholesterol, including ezetimibe, PCSK-9 inhibitors, bempedoic acid and inclisiran.  Discussed mechanisms of action, dosing, side effects, potential decreases in LDL cholesterol and costs.  Also reviewed potential options for patient assistance.  Plan: Patient agreeable to starting Repatha 140 mg q14d - will call patient once medication approved Repeat labs after:  2-3 months Lipid Liver function

## 2023-06-10 NOTE — Progress Notes (Unsigned)
   Office Visit    Patient Name: John Mathews Date of Encounter: 06/10/2023  Primary Care Provider:  Reubin Milan, MD Primary Cardiologist:  None  Chief Complaint    Hyperlipidemia   Significant Past Medical History   CAD CAC = 685 (87th percentile)  HTN Elevated at last visit, on amlodipine  asthma Mild, on Symbicort, loratadine     Allergies  Allergen Reactions   Diphenhydramine Hcl Other (See Comments)    hyperactivity    History of Present Illness    John Mathews is a 67 y.o. male patient of Dr [place], in the office today to discuss options for cholesterol management.  Baseline LDL from about 7 years ago is 170.  Most recent LDL was 121, drawn before starting on rosuvastatin 20 mg.  He was only able to take for about 3 weeks however, because of myalgias.    Insurance Carrier: [employer] [phone]   Repatha $47/month, $97/3 months   LDL Cholesterol goal:  LDL < 70  Current Medications:  none  Previously tried:  atorvastatin, rosuvastatin   Family Hx:  neither parent with MI/stroke, sister healthy except for breast cancer; no children  Social Hx: Tobacco: no Alcohol: rarely  Diet:  mix of home - breakfast and lunch eating out; 2 vegetables with protein for lunch, biscuts, eggs, etc for breakfast   Exercise: walks dog 3-4 days 1-2 miles, some hiking, canoeing   Accessory Clinical Findings   Lab Results  Component Value Date   CHOL 190 01/31/2023   HDL 54 01/31/2023   LDLCALC 121 (H) 01/31/2023   TRIG 84 01/31/2023   CHOLHDL 3.5 01/31/2023    No results found for: "LIPOA"  Lab Results  Component Value Date   ALT 22 01/31/2023   AST 17 01/31/2023   ALKPHOS 70 01/31/2023   BILITOT 0.5 01/31/2023   Lab Results  Component Value Date   CREATININE 0.92 03/16/2023   BUN 15 03/16/2023   NA 141 03/16/2023   K 4.7 03/16/2023   CL 105 03/16/2023   CO2 25 03/16/2023   No results found for: "HGBA1C"  Home Medications    Current  Outpatient Medications  Medication Sig Dispense Refill   amLODipine (NORVASC) 5 MG tablet Take 1 tablet (5 mg total) by mouth daily. 90 tablet 3   budesonide-formoterol (SYMBICORT) 160-4.5 MCG/ACT inhaler INHALE 1 PUFF BY MOUTH TWICE A DAY 30.6 each 3   fluorouracil (EFUDEX) 5 % cream Apply topically 2 (two) times daily.     loratadine (CLARITIN) 10 MG tablet Take 10 mg by mouth daily.     No current facility-administered medications for this visit.     Assessment & Plan    Mixed hyperlipidemia Assessment: Patient with ASCVD not at LDL goal of < 70 Most recent LDL 121 on 01/31/23 - prior to trial of rosuvastatin Not able to tolerate statins secondary to myalgias - rosuvastatin, atorvastatin Reviewed options for lowering LDL cholesterol, including ezetimibe, PCSK-9 inhibitors, bempedoic acid and inclisiran.  Discussed mechanisms of action, dosing, side effects, potential decreases in LDL cholesterol and costs.  Also reviewed potential options for patient assistance.  Plan: Patient agreeable to starting Repatha 140 mg q14d - will call patient once medication approved Repeat labs after:  2-3 months Lipid Liver function    Phillips Hay, PharmD CPP [facility] [address]  [place], [place] [address] [phone]  06/10/2023, 3:31 PM

## 2023-06-12 ENCOUNTER — Other Ambulatory Visit: Payer: Self-pay | Admitting: Internal Medicine

## 2023-06-12 DIAGNOSIS — J4520 Mild intermittent asthma, uncomplicated: Secondary | ICD-10-CM

## 2023-06-13 ENCOUNTER — Other Ambulatory Visit (HOSPITAL_COMMUNITY): Payer: Self-pay

## 2023-06-13 ENCOUNTER — Telehealth: Payer: Self-pay

## 2023-06-13 NOTE — Telephone Encounter (Signed)
Pharmacy Patient Advocate Encounter   Received notification from Physician's Office/PHARMD-KRISTIN that prior authorization for REPATHA is required/requested.   Insurance verification completed.   The patient is insured through [employer]/ [employer] .   Per test claim:  P/A APPROVED FROM 8.12.24 TO 2.8.25

## 2023-06-14 ENCOUNTER — Ambulatory Visit: Payer: PPO

## 2023-06-16 NOTE — Telephone Encounter (Signed)
See other encounter.

## 2023-06-17 MED ORDER — REPATHA SURECLICK 140 MG/ML ~~LOC~~ SOAJ: 140.0000 mg | SUBCUTANEOUS | 3 refills | Status: DC

## 2023-06-17 NOTE — Addendum Note (Signed)
Addended by: Rosalee Kaufman on: 06/17/2023 09:20 AM   Modules accepted: Orders

## 2023-06-21 ENCOUNTER — Other Ambulatory Visit: Payer: Self-pay

## 2023-06-21 ENCOUNTER — Ambulatory Visit (INDEPENDENT_AMBULATORY_CARE_PROVIDER_SITE_OTHER): Payer: PPO

## 2023-06-21 VITALS — BP 130/80 | Ht 72.0 in | Wt 229.6 lb

## 2023-06-21 DIAGNOSIS — Z0000 Encounter for general adult medical examination without abnormal findings: Secondary | ICD-10-CM | POA: Diagnosis not present

## 2023-06-21 DIAGNOSIS — J4520 Mild intermittent asthma, uncomplicated: Secondary | ICD-10-CM

## 2023-06-21 MED ORDER — BUDESONIDE-FORMOTEROL FUMARATE 160-4.5 MCG/ACT IN AERO
INHALATION_SPRAY | RESPIRATORY_TRACT | 3 refills | Status: DC
Start: 2023-06-21 — End: 2023-07-12

## 2023-06-21 NOTE — Progress Notes (Signed)
Subjective:   John Mathews is a 67 y.o. male who presents for Medicare Annual/Subsequent preventive examination.  Visit Complete: In person   Review of Systems     Cardiac Risk Factors include: advanced age (>80men, >41 women);dyslipidemia;male gender;obesity (BMI >30kg/m2)     Objective:    Today's Vitals   06/21/23 1019  BP: 130/80  Weight: 229 lb 9.6 oz (104.1 kg)  Height: 6' (1.829 m)   Body mass index is 31.14 kg/m.     06/21/2023   10:25 AM 08/26/2020   12:34 PM 01/04/2017   10:28 AM 01/04/2017   10:27 AM 06/13/2015   11:09 AM 06/03/2015    8:36 PM 06/03/2015    2:11 PM  Advanced Directives  Does Patient Have a Medical Advance Directive? No No Yes Yes Yes Yes No  Type of Best boy of [place];Living will Living will Healthcare Power of [place];Living will Living will   Copy of Healthcare Power of Attorney in Chart?   No - copy requested  No - copy requested No - copy requested   Would patient like information on creating a medical advance directive? No - Patient declined No - Patient declined    No - patient declined information Yes - Educational materials given    Current Medications (verified) Outpatient Encounter Medications as of 06/21/2023  Medication Sig   budesonide-formoterol (SYMBICORT) 160-4.5 MCG/ACT inhaler TAKE 1 PUFF BY MOUTH TWICE A DAY   Evolocumab (REPATHA SURECLICK) 140 MG/ML SOAJ Inject 140 mg into the skin every 14 (fourteen) days.   fluorouracil (EFUDEX) 5 % cream Apply topically 2 (two) times daily.   loratadine (CLARITIN) 10 MG tablet Take 10 mg by mouth daily.   amLODipine (NORVASC) 5 MG tablet Take 1 tablet (5 mg total) by mouth daily.   No facility-administered encounter medications on file as of 06/21/2023.    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    Past Surgical History:  Procedure Laterality Date   BASAL CELL CARCINOMA EXCISION     COLONOSCOPY  2012   repeat in 3 years   Family History  Problem Relation Age of Onset   Schizophrenia Mother    Dementia Father    Breast cancer Sister    Social History   Socioeconomic History   Marital status: Married    Spouse name: Not on file   Number of children: 0   Years of education: Not on file   Highest education level: Not on file  Occupational History   Not on file  Tobacco Use   Smoking status: Never   Smokeless tobacco: Never  Substance and Sexual Activity   Alcohol use: Yes    Alcohol/week: 0.0 standard drinks of alcohol    Comment: occasional   Drug use: No   Sexual activity: Yes    Partners: Female  Other Topics Concern   Not on file  Social History Narrative   Land surveying         Social Determinants of Health   Financial Resource Strain: Low Risk  (06/21/2023)   Overall Financial Resource Strain (CARDIA)    Difficulty of Paying Living Expenses: Not hard at all  Food Insecurity: No Food Insecurity (06/21/2023)   Hunger Vital Sign    Worried About Running Out of Food  in the Last Year: Never true    Ran Out of Food in the Last Year: Never true  Transportation Needs: No Transportation Needs (06/21/2023)   PRAPARE - Administrator, Civil Service (Medical): No    Lack of Transportation (Non-Medical): No  Physical Activity: Sufficiently Active (06/21/2023)   Exercise Vital Sign    Days of Exercise per Week: 4 days    Minutes of Exercise per Session: 60 min  Stress: No Stress Concern Present (06/21/2023)   [employer] of Occupational Health - Occupational Stress Questionnaire    Feeling of Stress : Only a little  Social Connections: Moderately Isolated (06/21/2023)   Social Connection and Isolation Panel [NHANES]    Frequency of Communication with Friends and Family: More than three times a week    Frequency of Social Gatherings with  Friends and Family: Once a week    Attends Religious Services: Never    Database administrator or Organizations: No    Attends Engineer, structural: Never    Marital Status: Married    Tobacco Counseling Counseling given: Not Answered   Clinical Intake:  Pre-visit preparation completed: Yes  Pain : No/denies pain     Nutritional Risks: None Diabetes: No  How often do you need to have someone help you when you read instructions, pamphlets, or other written materials from your doctor or pharmacy?: 1 - Never  Interpreter Needed?: No  Information entered by :: Kennedy Bucker, LPN   Activities of Daily Living    06/21/2023   10:26 AM  In your present state of health, do you have any difficulty performing the following activities:  Hearing? 0  Vision? 0  Difficulty concentrating or making decisions? 0  Walking or climbing stairs? 0  Dressing or bathing? 0  Doing errands, shopping? 0  Preparing Food and eating ? N  Using the Toilet? N  In the past six months, have you accidently leaked urine? N  Do you have problems with loss of bowel control? N  Managing your Medications? N  Managing your Finances? N  Housekeeping or managing your Housekeeping? N    Patient Care Team: Reubin Milan, MD as PCP - General (Internal Medicine) Jesusita Oka, MD (Dermatology) Mohammed Kindle Lilia Argue (Inactive) as Referring Physician (Gastroenterology)  Indicate any recent Medical Services you may have received from other than [facility] providers in the past year (date may be approximate).     Assessment:   This is a routine wellness examination for John Mathews.  Hearing/Vision screen Hearing Screening - Comments:: No aids Vision Screening - Comments:: Reading- Dr.King  Dietary issues and exercise activities discussed:     Goals Addressed             This Visit's Progress    DIET - EAT MORE FRUITS AND VEGETABLES         Depression Screen    06/21/2023   10:23 AM 01/31/2023     8:25 AM 01/26/2022    8:38 AM 05/05/2021   11:02 AM 01/21/2021    8:41 AM 01/21/2020    8:14 AM 01/19/2019    8:25 AM  PHQ 2/9 Scores  PHQ - 2 Score 0 0 0 0 0 0 0  PHQ- 9 Score 0 3 0 0 0 2     Fall Risk    06/21/2023   10:25 AM 01/31/2023    8:25 AM 01/26/2022    8:38 AM 05/05/2021   11:02 AM 01/21/2021  8:41 AM  Fall Risk   Falls in the past year? 0 1 1 0 0  Number falls in past yr: 0 1 1 0   Injury with Fall? 0 0 0 0   Risk for fall due to : No Fall Risks No Fall Risks History of fall(s)    Follow up Falls prevention discussed;Falls evaluation completed Falls evaluation completed Falls evaluation completed Falls evaluation completed Falls evaluation completed    MEDICARE RISK AT HOME: Medicare Risk at Home Any stairs in or around the home?: No If so, are there any without handrails?: No Home free of loose throw rugs in walkways, pet beds, electrical cords, etc?: Yes Adequate lighting in your home to reduce risk of falls?: Yes Life alert?: No Use of a cane, walker or w/c?: No Grab bars in the bathroom?: No Shower chair or bench in shower?: No Elevated toilet seat or a handicapped toilet?: No  TIMED UP AND GO:  Was the test performed?  Yes  Length of time to ambulate 10 feet: 4 sec Gait steady and fast without use of assistive device    Cognitive Function:        06/21/2023   10:29 AM 01/31/2023    8:32 AM  6CIT Screen  What Year? 0 points 0 points  What month? 0 points 0 points  What time? 0 points 0 points  Count back from 20 0 points 0 points  Months in reverse 2 points 0 points  Repeat phrase 0 points 2 points  Total Score 2 points 2 points    Immunizations Immunization History  Administered Date(s) Administered   COVID-19, mRNA, vaccine(Comirnaty)12 years and older 08/13/2022   Fluad Quad(high Dose 65+) 08/13/2022   Influenza,inj,Quad PF,6+ Mos 07/24/2018, 08/17/2019, 09/12/2021   Influenza-Unspecified 07/19/2015, 08/27/2017, 08/01/2020   Moderna  SARS-COV2 Booster Vaccination 09/29/2020   PNEUMOCOCCAL CONJUGATE-20 01/31/2023   Pneumococcal Polysaccharide-23 01/12/2011   Tdap 04/20/2011, 01/26/2022   Zoster Recombinant(Shingrix) 01/19/2019, 04/23/2019   Zoster, Live 01/24/2014    TDAP status: Up to date  Flu Vaccine status: Up to date  Pneumococcal vaccine status: Up to date  Covid-19 vaccine status: Completed vaccines  Qualifies for Shingles Vaccine? Yes   Zostavax completed Yes   Shingrix Completed?: Yes  Screening Tests Health Maintenance  Topic Date Due   COVID-19 Vaccine (2 - Pfizer risk series) 09/03/2022   INFLUENZA VACCINE  06/02/2023   Medicare Annual Wellness (AWV)  06/20/2024   Colonoscopy  09/08/2025   DTaP/Tdap/Td (3 - Td or Tdap) 01/27/2032   Pneumonia Vaccine 40+ Years old  Completed   Hepatitis C Screening  Completed   Zoster Vaccines- Shingrix  Completed   HPV VACCINES  Aged Out    Health Maintenance  Health Maintenance Due  Topic Date Due   COVID-19 Vaccine (2 - Pfizer risk series) 09/03/2022   INFLUENZA VACCINE  06/02/2023    Colorectal cancer screening: Type of screening: Colonoscopy. Completed 09/09/15. Repeat every 10 years  Lung Cancer Screening: (Low Dose CT Chest recommended if Age 62-80 years, 20 pack-year currently smoking OR have quit w/in 15years.) does not qualify.    Additional Screening:  Hepatitis C Screening: does qualify; Completed 06/05/15  Vision Screening: Recommended annual ophthalmology exams for early detection of glaucoma and other disorders of the eye. Is the patient up to date with their annual eye exam?  Yes  Who is the provider or what is the name of the office in which the patient attends annual eye exams? Dr.King If  pt is not established with a provider, would they like to be referred to a provider to establish care? No .   Dental Screening: Recommended annual dental exams for proper oral hygiene   Community Resource Referral / Chronic Care Management: CRR  required this visit?  No   CCM required this visit?  No     Plan:     I have personally reviewed and noted the following in the patient's chart:   Medical and social history Use of alcohol, tobacco or illicit drugs  Current medications and supplements including opioid prescriptions. Patient is not currently taking opioid prescriptions. Functional ability and status Nutritional status Physical activity Advanced directives List of other physicians Hospitalizations, surgeries, and ER visits in previous 12 months Vitals Screenings to include cognitive, depression, and falls Referrals and appointments  In addition, I have reviewed and discussed with patient certain preventive protocols, quality metrics, and best practice recommendations. A written personalized care plan for preventive services as well as general preventive health recommendations were provided to patient.     Hal Hope, LPN   2/95/1884   After Visit Summary: on my chart  Nurse Notes: none

## 2023-06-21 NOTE — Patient Instructions (Addendum)
John Mathews , Thank you for taking time to come for your Medicare Wellness Visit. I appreciate your ongoing commitment to your health goals. Please review the following plan we discussed and let me know if I can assist you in the future.   Referrals/Orders/Follow-Ups/Clinician Recommendations: none  This is a list of the screening recommended for you and due dates:  Health Maintenance  Topic Date Due   COVID-19 Vaccine (2 - Pfizer risk series) 09/03/2022   Flu Shot  06/02/2023   Medicare Annual Wellness Visit  06/20/2024   Colon Cancer Screening  09/08/2025   DTaP/Tdap/Td vaccine (3 - Td or Tdap) 01/27/2032   Pneumonia Vaccine  Completed   Hepatitis C Screening  Completed   Zoster (Shingles) Vaccine  Completed   HPV Vaccine  Aged Out    Advanced directives: (ACP Link)Information on Advanced Care Planning can be found at [facility] Advance Health Care Directives Advance Health Care Directives ([email or web address]   Next Medicare Annual Wellness Visit scheduled for next year: Yes    06/26/24 @ 10:00 in person  Preventive Care 65 Years and Older, Male  Preventive care refers to lifestyle choices and visits with your health care provider that can promote health and wellness. What does preventive care include? A yearly physical exam. This is also called an annual well check. Dental exams once or twice a year. Routine eye exams. Ask your health care provider how often you should have your eyes checked. Personal lifestyle choices, including: Daily care of your teeth and gums. Regular physical activity. Eating a healthy diet. Avoiding tobacco and drug use. Limiting alcohol use. Practicing safe sex. Taking low doses of aspirin every day. Taking vitamin and mineral supplements as recommended by your health care provider. What happens during an annual well check? The services and screenings done by your health care provider during your annual well check will depend on your  age, overall health, lifestyle risk factors, and family history of disease. Counseling  Your health care provider may ask you questions about your: Alcohol use. Tobacco use. Drug use. Emotional well-being. Home and relationship well-being. Sexual activity. Eating habits. History of falls. Memory and ability to understand (cognition). Work and work Astronomer. Screening  You may have the following tests or measurements: Height, weight, and BMI. Blood pressure. Lipid and cholesterol levels. These may be checked every 5 years, or more frequently if you are over 70 years old. Skin check. Lung cancer screening. You may have this screening every year starting at age 77 if you have a 30-pack-year history of smoking and currently smoke or have quit within the past 15 years. Fecal occult blood test (FOBT) of the stool. You may have this test every year starting at age 15. Flexible sigmoidoscopy or colonoscopy. You may have a sigmoidoscopy every 5 years or a colonoscopy every 10 years starting at age 60. Prostate cancer screening. Recommendations will vary depending on your family history and other risks. Hepatitis C blood test. Hepatitis B blood test. Sexually transmitted disease (STD) testing. Diabetes screening. This is done by checking your blood sugar (glucose) after you have not eaten for a while (fasting). You may have this done every 1-3 years. Abdominal aortic aneurysm (AAA) screening. You may need this if you are a current or former smoker. Osteoporosis. You may be screened starting at age 54 if you are at high risk. Talk with your health care provider about your test results, treatment options, and if necessary, the need for more  tests. Vaccines  Your health care provider may recommend certain vaccines, such as: Influenza vaccine. This is recommended every year. Tetanus, diphtheria, and acellular pertussis (Tdap, Td) vaccine. You may need a Td booster every 10 years. Zoster  vaccine. You may need this after age 40. Pneumococcal 13-valent conjugate (PCV13) vaccine. One dose is recommended after age 31. Pneumococcal polysaccharide (PPSV23) vaccine. One dose is recommended after age 4. Talk to your health care provider about which screenings and vaccines you need and how often you need them. This information is not intended to replace advice given to you by your health care provider. Make sure you discuss any questions you have with your health care provider. Document Released: 11/14/2015 Document Revised: 07/07/2016 Document Reviewed: 08/19/2015 Elsevier Interactive Patient Education  2017 [employer].  Fall Prevention in the Home Falls can cause injuries. They can happen to people of all ages. There are many things you can do to make your home safe and to help prevent falls. What can I do on the outside of my home? Regularly fix the edges of walkways and driveways and fix any cracks. Remove anything that might make you trip as you walk through a door, such as a raised step or threshold. Trim any bushes or trees on the path to your home. Use bright outdoor lighting. Clear any walking paths of anything that might make someone trip, such as rocks or tools. Regularly check to see if handrails are loose or broken. Make sure that both sides of any steps have handrails. Any raised decks and porches should have guardrails on the edges. Have any leaves, snow, or ice cleared regularly. Use sand or salt on walking paths during winter. Clean up any spills in your garage right away. This includes oil or grease spills. What can I do in the bathroom? Use night lights. Install grab bars by the toilet and in the tub and shower. Do not use towel bars as grab bars. Use non-skid mats or decals in the tub or shower. If you need to sit down in the shower, use a plastic, non-slip stool. Keep the floor dry. Clean up any water that spills on the floor as soon as it happens. Remove  soap buildup in the tub or shower regularly. Attach bath mats securely with double-sided non-slip rug tape. Do not have throw rugs and other things on the floor that can make you trip. What can I do in the bedroom? Use night lights. Make sure that you have a light by your bed that is easy to reach. Do not use any sheets or blankets that are too big for your bed. They should not hang down onto the floor. Have a firm chair that has side arms. You can use this for support while you get dressed. Do not have throw rugs and other things on the floor that can make you trip. What can I do in the kitchen? Clean up any spills right away. Avoid walking on wet floors. Keep items that you use a lot in easy-to-reach places. If you need to reach something above you, use a strong step stool that has a grab bar. Keep electrical cords out of the way. Do not use floor polish or wax that makes floors slippery. If you must use wax, use non-skid floor wax. Do not have throw rugs and other things on the floor that can make you trip. What can I do with my stairs? Do not leave any items on the stairs. Make sure  that there are handrails on both sides of the stairs and use them. Fix handrails that are broken or loose. Make sure that handrails are as long as the stairways. Check any carpeting to make sure that it is firmly attached to the stairs. Fix any carpet that is loose or worn. Avoid having throw rugs at the top or bottom of the stairs. If you do have throw rugs, attach them to the floor with carpet tape. Make sure that you have a light switch at the top of the stairs and the bottom of the stairs. If you do not have them, ask someone to add them for you. What else can I do to help prevent falls? Wear shoes that: Do not have high heels. Have rubber bottoms. Are comfortable and fit you well. Are closed at the toe. Do not wear sandals. If you use a stepladder: Make sure that it is fully opened. Do not climb a  closed stepladder. Make sure that both sides of the stepladder are locked into place. Ask someone to hold it for you, if possible. Clearly mark and make sure that you can see: Any grab bars or handrails. First and last steps. Where the edge of each step is. Use tools that help you move around (mobility aids) if they are needed. These include: Canes. Walkers. Scooters. Crutches. Turn on the lights when you go into a dark area. Replace any light bulbs as soon as they burn out. Set up your furniture so you have a clear path. Avoid moving your furniture around. If any of your floors are uneven, fix them. If there are any pets around you, be aware of where they are. Review your medicines with your doctor. Some medicines can make you feel dizzy. This can increase your chance of falling. Ask your doctor what other things that you can do to help prevent falls. This information is not intended to replace advice given to you by your health care provider. Make sure you discuss any questions you have with your health care provider. Document Released: 08/14/2009 Document Revised: 03/25/2016 Document Reviewed: 11/22/2014 Elsevier Interactive Patient Education  2017 [employer].

## 2023-06-30 DIAGNOSIS — C44329 Squamous cell carcinoma of skin of other parts of face: Secondary | ICD-10-CM | POA: Diagnosis not present

## 2023-06-30 DIAGNOSIS — D0439 Carcinoma in situ of skin of other parts of face: Secondary | ICD-10-CM | POA: Diagnosis not present

## 2023-07-12 ENCOUNTER — Telehealth: Payer: Self-pay | Admitting: Internal Medicine

## 2023-07-12 ENCOUNTER — Other Ambulatory Visit: Payer: Self-pay | Admitting: Internal Medicine

## 2023-07-12 DIAGNOSIS — J4520 Mild intermittent asthma, uncomplicated: Secondary | ICD-10-CM

## 2023-07-12 MED ORDER — FLUTICASONE-SALMETEROL 230-21 MCG/ACT IN AERO
2.0000 | INHALATION_SPRAY | Freq: Two times a day (BID) | RESPIRATORY_TRACT | 3 refills | Status: DC
Start: 2023-07-12 — End: 2024-07-19

## 2023-07-12 NOTE — Telephone Encounter (Signed)
Patient has called and stated he spoke with [facility] about his S[facility]icort and [facility] tried to order this for patient but stated his insurance company is refusing this medication. Patient stated it is unclear as to why the insurance company is refusing this, he stated the insurance co. is refusing the generic of Symbicort, BUD, as well. Please advise and send an alternative in for patient if needed. Please update patient on this.  Callback # [phone]

## 2023-08-19 DIAGNOSIS — D0439 Carcinoma in situ of skin of other parts of face: Secondary | ICD-10-CM | POA: Diagnosis not present

## 2023-10-14 ENCOUNTER — Other Ambulatory Visit: Payer: Self-pay | Admitting: Cardiology

## 2023-10-14 DIAGNOSIS — E782 Mixed hyperlipidemia: Secondary | ICD-10-CM | POA: Diagnosis not present

## 2023-10-15 LAB — LIPID PANEL
Chol/HDL Ratio: 2.6 {ratio} (ref 0.0–5.0)
Cholesterol, Total: 118 mg/dL (ref 100–199)
HDL: 45 mg/dL (ref >39)
LDL Chol Calc (NIH): 43 mg/dL (ref 0–99)
Triglycerides: 186 mg/dL — ABNORMAL HIGH (ref 0–149)
VLDL Cholesterol Cal: 30 mg/dL (ref 5–40)

## 2023-10-15 LAB — HEPATIC FUNCTION PANEL
ALT: 15 [IU]/L (ref 0–44)
AST: 17 [IU]/L (ref 0–40)
Albumin: 4.1 g/dL (ref 3.9–4.9)
Alkaline Phosphatase: 83 [IU]/L (ref 44–121)
Bilirubin Total: 0.3 mg/dL (ref 0.0–1.2)
Bilirubin, Direct: 0.13 mg/dL (ref 0.00–0.40)
Total Protein: 6.5 g/dL (ref 6.0–8.5)

## 2023-10-17 ENCOUNTER — Telehealth: Payer: Self-pay | Admitting: Pharmacy Technician

## 2023-10-17 ENCOUNTER — Telehealth: Payer: Self-pay | Admitting: Cardiology

## 2023-10-17 ENCOUNTER — Other Ambulatory Visit (HOSPITAL_COMMUNITY): Payer: Self-pay

## 2023-10-17 NOTE — Telephone Encounter (Signed)
Pharmacy Patient Advocate Encounter   Received notification from Pt Calls Messages that prior authorization for repatha is required/requested.   Insurance verification completed.   The patient is insured through [facility] ADVANTAGE/RX ADVANCE .   Per test claim: Refill too soon. PA is not needed at this time. Medication was filled 09/07/23. Next eligible fill date is 11/09/23.

## 2023-10-17 NOTE — Telephone Encounter (Signed)
 Called patient with no answer. Left message to return call.

## 2023-10-17 NOTE — Telephone Encounter (Signed)
Pt returning nurses phone call. Please advise ?

## 2023-10-17 NOTE — Telephone Encounter (Signed)
See previous telephone note.

## 2023-10-17 NOTE — Telephone Encounter (Signed)
Called and spoke to patient to inform him that medication can not be filled until 11/09/23. Patient verbalized understanding and agree.

## 2023-10-17 NOTE — Telephone Encounter (Signed)
Patient can not fill until 11/09/23. No PA needed

## 2023-10-27 DIAGNOSIS — H2513 Age-related nuclear cataract, bilateral: Secondary | ICD-10-CM | POA: Diagnosis not present

## 2023-10-27 DIAGNOSIS — H5704 Mydriasis: Secondary | ICD-10-CM | POA: Diagnosis not present

## 2023-12-21 DIAGNOSIS — L570 Actinic keratosis: Secondary | ICD-10-CM | POA: Diagnosis not present

## 2023-12-21 DIAGNOSIS — D485 Neoplasm of uncertain behavior of skin: Secondary | ICD-10-CM | POA: Diagnosis not present

## 2023-12-21 DIAGNOSIS — L578 Other skin changes due to chronic exposure to nonionizing radiation: Secondary | ICD-10-CM | POA: Diagnosis not present

## 2023-12-21 DIAGNOSIS — Z872 Personal history of diseases of the skin and subcutaneous tissue: Secondary | ICD-10-CM | POA: Diagnosis not present

## 2023-12-21 DIAGNOSIS — Z85828 Personal history of other malignant neoplasm of skin: Secondary | ICD-10-CM | POA: Diagnosis not present

## 2023-12-21 DIAGNOSIS — Z859 Personal history of malignant neoplasm, unspecified: Secondary | ICD-10-CM | POA: Diagnosis not present

## 2023-12-21 DIAGNOSIS — C44329 Squamous cell carcinoma of skin of other parts of face: Secondary | ICD-10-CM | POA: Diagnosis not present

## 2024-01-09 DIAGNOSIS — C44320 Squamous cell carcinoma of skin of unspecified parts of face: Secondary | ICD-10-CM | POA: Diagnosis not present

## 2024-01-09 DIAGNOSIS — C44329 Squamous cell carcinoma of skin of other parts of face: Secondary | ICD-10-CM | POA: Diagnosis not present

## 2024-02-01 ENCOUNTER — Ambulatory Visit (INDEPENDENT_AMBULATORY_CARE_PROVIDER_SITE_OTHER): Admitting: Internal Medicine

## 2024-02-01 ENCOUNTER — Encounter: Payer: Self-pay | Admitting: Internal Medicine

## 2024-02-01 VITALS — BP 128/76 | HR 85 | Ht 72.0 in | Wt 228.5 lb

## 2024-02-01 DIAGNOSIS — I2510 Atherosclerotic heart disease of native coronary artery without angina pectoris: Secondary | ICD-10-CM | POA: Diagnosis not present

## 2024-02-01 DIAGNOSIS — K219 Gastro-esophageal reflux disease without esophagitis: Secondary | ICD-10-CM

## 2024-02-01 DIAGNOSIS — G720 Drug-induced myopathy: Secondary | ICD-10-CM | POA: Insufficient documentation

## 2024-02-01 DIAGNOSIS — E782 Mixed hyperlipidemia: Secondary | ICD-10-CM | POA: Diagnosis not present

## 2024-02-01 DIAGNOSIS — Z0000 Encounter for general adult medical examination without abnormal findings: Secondary | ICD-10-CM

## 2024-02-01 DIAGNOSIS — J4520 Mild intermittent asthma, uncomplicated: Secondary | ICD-10-CM

## 2024-02-01 DIAGNOSIS — Z125 Encounter for screening for malignant neoplasm of prostate: Secondary | ICD-10-CM

## 2024-02-01 DIAGNOSIS — I10 Essential (primary) hypertension: Secondary | ICD-10-CM | POA: Diagnosis not present

## 2024-02-01 NOTE — Assessment & Plan Note (Signed)
 Started on amlodipine by Cardiology last year. No side effects. Blood pressure has been stable.

## 2024-02-01 NOTE — Assessment & Plan Note (Signed)
 Doing well on Advair with PRN albuterol.

## 2024-02-01 NOTE — Progress Notes (Signed)
 Date:  02/01/2024   Name:  DRAPER GALLON   DOB:  Jan 13, 1956   MRN:  [id number]   Chief Complaint: Annual Exam John Mathews is a 68 y.o. male who presents today for his Complete Annual Exam. He feels well. He reports exercising walks 2 - 3 miles, everyday. He reports he is sleeping fairly well.   Health Maintenance  Topic Date Due   COVID-19 Vaccine (2 - Pfizer risk series) 02/17/2024*   Flu Shot  06/01/2024   Medicare Annual Wellness Visit  06/20/2024   Colon Cancer Screening  09/08/2025   DTaP/Tdap/Td vaccine (3 - Td or Tdap) 01/27/2032   Pneumonia Vaccine  Completed   Hepatitis C Screening  Completed   Zoster (Shingles) Vaccine  Completed   HPV Vaccine  Aged Out  *Topic was postponed. The date shown is not the original due date.    Lab Results  Component Value Date   PSA1 0.4 01/31/2023   PSA1 0.3 01/26/2022   PSA1 0.4 01/21/2021    Asthma There is no cough, shortness of breath or wheezing. Pertinent negatives include no chest pain or headaches. His past medical history is significant for asthma.  Hyperlipidemia This is a chronic problem. The problem is controlled. Pertinent negatives include no chest pain or shortness of breath. Treatments tried: Repatha. The current treatment provides significant improvement of lipids.  CAD - high coronary calcium score last year.  Seen by Cardiology.  ECHO normal. On Repatha without side effects. He tried several statins but could not tolerate them.  Review of Systems  Constitutional:  Negative for fatigue and unexpected weight change.  HENT:  Negative for nosebleeds.   Eyes:  Negative for visual disturbance.  Respiratory:  Negative for cough, chest tightness, shortness of breath and wheezing.   Cardiovascular:  Negative for chest pain, palpitations and leg swelling.  Gastrointestinal:  Negative for abdominal pain, constipation and diarrhea.  Neurological:  Negative for dizziness, weakness, light-headedness and  headaches.     Lab Results  Component Value Date   NA 141 03/16/2023   K 4.7 03/16/2023   CO2 25 03/16/2023   GLUCOSE 105 (H) 03/16/2023   BUN 15 03/16/2023   CREATININE 0.92 03/16/2023   CALCIUM 8.9 03/16/2023   EGFR 92 03/16/2023   GFRNONAA >60 08/26/2020   Lab Results  Component Value Date   CHOL 118 10/14/2023   HDL 45 10/14/2023   LDLCALC 43 10/14/2023   TRIG 186 (H) 10/14/2023   CHOLHDL 2.6 10/14/2023   Lab Results  Component Value Date   TSH 0.762 08/18/2015   No results found for: "HGBA1C" Lab Results  Component Value Date   WBC 4.1 01/31/2023   HGB 15.8 01/31/2023   HCT 44.9 01/31/2023   MCV 98 (H) 01/31/2023   PLT 171 01/31/2023   Lab Results  Component Value Date   ALT 15 10/14/2023   AST 17 10/14/2023   ALKPHOS 83 10/14/2023   BILITOT 0.3 10/14/2023   No results found for: "25OHVITD2", "25OHVITD3", "VD25OH"   Patient Active Problem List   Diagnosis Date Noted   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   Essential hypertension 02/01/2024   CAD (coronary artery disease), native coronary artery 04/01/2023   DOE (dyspnea on exertion) 03/15/2023   Hx of squamous cell carcinoma of skin 01/21/2021   Plantar fasciitis of right foot 01/18/2018   Gastroesophageal reflux disease 01/04/2017   Mild mitral regurgitation 10/20/2016   Mixed hyperlipidemia 08/19/2016   Mild intermittent asthma  without complication 08/19/2016   Tubular adenoma of colon 05/21/2015   Dermatitis of multiple sites 05/21/2015    Allergies  Allergen Reactions   Diphenhydramine Hcl Other (See Comments)    hyperactivity    Past Surgical History:  Procedure Laterality Date   BASAL CELL CARCINOMA EXCISION     COLONOSCOPY  2012   repeat in 3 years    Social History   Tobacco Use   Smoking status: Never   Smokeless tobacco: Never  Substance Use Topics   Alcohol use: Yes    Alcohol/week: 0.0 standard drinks of alcohol    Comment: occasional   Drug use: No     Medication  list has been reviewed and updated.  Current Meds  Medication Sig   Evolocumab (REPATHA SURECLICK) 140 MG/ML SOAJ Inject 140 mg into the skin every 14 (fourteen) days.   fluticasone-salmeterol (ADVAIR HFA) 230-21 MCG/ACT inhaler Inhale 2 puffs into the lungs 2 (two) times daily.   loratadine (CLARITIN) 10 MG tablet Take 10 mg by mouth daily.       02/01/2024    3:51 PM 01/31/2023    8:25 AM 01/26/2022    8:38 AM 05/05/2021   11:02 AM  GAD 7 : Generalized Anxiety Score  Nervous, Anxious, on Edge 0 0 0 0  Control/stop worrying 0 0 0 0  Worry too much - different things 0 1 0 0  Trouble relaxing 0 0 0 0  Restless 1 0 0 0  Easily annoyed or irritable 0 1 0 0  Afraid - awful might happen 0 0 0 0  Total GAD 7 Score 1 2 0 0  Anxiety Difficulty Not difficult at all Not difficult at all Not difficult at all Not difficult at all       02/01/2024    3:51 PM 06/21/2023   10:23 AM 01/31/2023    8:25 AM  Depression screen PHQ 2/9  Decreased Interest 0 0 0  Down, Depressed, Hopeless 0 0 0  PHQ - 2 Score 0 0 0  Altered sleeping 2 0 1  Tired, decreased energy 0 0 1  Change in appetite 0 0 1  Feeling bad or failure about yourself  0 0 0  Trouble concentrating 0 0 0  Moving slowly or fidgety/restless 0 0 0  Suicidal thoughts 0 0 0  PHQ-9 Score 2 0 3  Difficult doing work/chores Not difficult at all Not difficult at all Somewhat difficult    BP Readings from Last 3 Encounters:  02/01/24 128/76  06/21/23 130/80  03/29/23 129/76    Physical Exam Vitals and nursing note reviewed.  Constitutional:      Appearance: Normal appearance. He is well-developed.  HENT:     Head: Normocephalic.     Right Ear: Tympanic membrane, ear canal and external ear normal.     Left Ear: Tympanic membrane, ear canal and external ear normal.     Nose: Nose normal.  Eyes:     Conjunctiva/sclera: Conjunctivae normal.     Pupils: Pupils are equal, round, and reactive to light.  Neck:     Thyroid: No  thyromegaly.     Vascular: No carotid bruit.  Cardiovascular:     Rate and Rhythm: Normal rate and regular rhythm.     Heart sounds: Normal heart sounds.  Pulmonary:     Effort: Pulmonary effort is normal.     Breath sounds: Normal breath sounds. No wheezing.  Chest:  Breasts:    Right: No mass.  Left: No mass.  Abdominal:     General: Bowel sounds are normal.     Palpations: Abdomen is soft.     Tenderness: There is no abdominal tenderness.  Musculoskeletal:        General: Normal range of motion.     Cervical back: Normal range of motion and neck supple.  Lymphadenopathy:     Cervical: No cervical adenopathy.  Skin:    General: Skin is warm and dry.  Neurological:     Mental Status: He is alert and oriented to person, place, and time.     Deep Tendon Reflexes: Reflexes are normal and symmetric.  Psychiatric:        Attention and Perception: Attention normal.        Mood and Affect: Mood normal.        Thought Content: Thought content normal.     Wt Readings from Last 3 Encounters:  02/01/24 228 lb 8 oz (103.6 kg)  06/21/23 229 lb 9.6 oz (104.1 kg)  02/28/23 231 lb 9.6 oz (105.1 kg)    BP 128/76   Pulse 85   Ht 6' (1.829 m)   Wt 228 lb 8 oz (103.6 kg)   SpO2 100%   BMI 30.99 kg/m   Assessment and Plan:  Problem List Items Addressed This Visit       Unprioritized   Mixed hyperlipidemia (Chronic)   LDL is  Lab Results  Component Value Date   LDLCALC 43 10/14/2023   Current regimen is Repatha.  No medication side effects noted. Goal LDL is <55.       Relevant Orders   Lipid panel   Mild intermittent asthma without complication (Chronic)   Doing well on Advair with PRN albuterol.      Relevant Orders   CBC with Differential/Platelet   Comprehensive metabolic panel with GFR   Gastroesophageal reflux disease (Chronic)   Occasional gerd symptoms which are intermittent  No red flag symptoms noted.      Relevant Orders   CBC with  Differential/Platelet   CAD (coronary artery disease), native coronary artery (Chronic)   Stable without shortness of breath or chest pain. He believes that his shortness of breath may have been uncontrolled asthma.      Drug-induced myopathy (Chronic)   Essential hypertension (Chronic)   Started on amlodipine by Cardiology last year. No side effects. Blood pressure has been stable.      Relevant Orders   CBC with Differential/Platelet   Comprehensive metabolic panel with GFR   Urinalysis, Routine w reflex microscopic   Other Visit Diagnoses       Annual physical exam    -  Primary     Prostate cancer screening       Relevant Orders   PSA       Return in about 6 months (around 08/02/2024) for CAD, HTN.    Reubin Milan, MD [facility] [place]

## 2024-02-01 NOTE — Assessment & Plan Note (Signed)
 Stable without shortness of breath or chest pain. He believes that his shortness of breath may have been uncontrolled asthma.

## 2024-02-01 NOTE — Assessment & Plan Note (Signed)
 LDL is  Lab Results  Component Value Date   LDLCALC 43 10/14/2023   Current regimen is Repatha.  No medication side effects noted. Goal LDL is <55.

## 2024-02-01 NOTE — Assessment & Plan Note (Signed)
 Occasional gerd symptoms which are intermittent  No red flag symptoms noted.

## 2024-02-02 ENCOUNTER — Encounter: Payer: Self-pay | Admitting: Internal Medicine

## 2024-02-03 ENCOUNTER — Encounter: Payer: Self-pay | Admitting: Internal Medicine

## 2024-02-03 LAB — PSA: Prostate Specific Ag, Serum: 0.5 ng/mL (ref 0.0–4.0)

## 2024-02-03 LAB — CBC WITH DIFFERENTIAL/PLATELET
Basophils Absolute: 0 10*3/uL (ref 0.0–0.2)
Basos: 1 %
EOS (ABSOLUTE): 0.2 10*3/uL (ref 0.0–0.4)
Eos: 4 %
Hematocrit: 44.2 % (ref 37.5–51.0)
Hemoglobin: 15.1 g/dL (ref 13.0–17.7)
Immature Grans (Abs): 0 10*3/uL (ref 0.0–0.1)
Immature Granulocytes: 1 %
Lymphocytes Absolute: 1.6 10*3/uL (ref 0.7–3.1)
Lymphs: 26 %
MCH: 34 pg — ABNORMAL HIGH (ref 26.6–33.0)
MCHC: 34.2 g/dL (ref 31.5–35.7)
MCV: 100 fL — ABNORMAL HIGH (ref 79–97)
Monocytes Absolute: 0.5 10*3/uL (ref 0.1–0.9)
Monocytes: 8 %
Neutrophils Absolute: 3.7 10*3/uL (ref 1.4–7.0)
Neutrophils: 60 %
Platelets: 194 10*3/uL (ref 150–450)
RBC: 4.44 x10E6/uL (ref 4.14–5.80)
RDW: 13.1 % (ref 11.6–15.4)
WBC: 6 10*3/uL (ref 3.4–10.8)

## 2024-02-03 LAB — URINALYSIS, ROUTINE W REFLEX MICROSCOPIC

## 2024-02-03 LAB — LIPID PANEL
Chol/HDL Ratio: 2.2 ratio (ref 0.0–5.0)
Cholesterol, Total: 123 mg/dL (ref 100–199)
HDL: 57 mg/dL (ref >39)
LDL Chol Calc (NIH): 51 mg/dL (ref 0–99)
Triglycerides: 77 mg/dL (ref 0–149)
VLDL Cholesterol Cal: 15 mg/dL (ref 5–40)

## 2024-02-03 LAB — COMPREHENSIVE METABOLIC PANEL WITH GFR
ALT: 20 IU/L (ref 0–44)
AST: 21 IU/L (ref 0–40)
Albumin: 4.4 g/dL (ref 3.9–4.9)
Alkaline Phosphatase: 75 IU/L (ref 44–121)
BUN/Creatinine Ratio: 14 (ref 10–24)
BUN: 14 mg/dL (ref 8–27)
Bilirubin Total: 0.5 mg/dL (ref 0.0–1.2)
CO2: 24 mmol/L (ref 20–29)
Calcium: 9.4 mg/dL (ref 8.6–10.2)
Chloride: 103 mmol/L (ref 96–106)
Creatinine, Ser: 1.03 mg/dL (ref 0.76–1.27)
Globulin, Total: 2.5 g/dL (ref 1.5–4.5)
Glucose: 89 mg/dL (ref 70–99)
Potassium: 4.3 mmol/L (ref 3.5–5.2)
Sodium: 141 mmol/L (ref 134–144)
Total Protein: 6.9 g/dL (ref 6.0–8.5)
eGFR: 80 mL/min/{1.73_m2} (ref >59)

## 2024-02-20 ENCOUNTER — Telehealth: Payer: Self-pay | Admitting: Pharmacy Technician

## 2024-02-20 NOTE — Telephone Encounter (Signed)
 Pharmacy Patient Advocate Encounter  Received notification from [employer] that Prior Authorization for repatha  has been APPROVED from 02/20/24 to 02/19/25. Spoke to pharmacy to process.Copay is $117.50 FOR 90 DAYS .    PA #/Case ID/Reference #: [id number]

## 2024-02-20 NOTE — Telephone Encounter (Signed)
 Pharmacy Patient Advocate Encounter   Received notification from CoverMyMeds that prior authorization for repatha  is required/requested.   Insurance verification completed.   The patient is insured through [facility] ADVANTAGE/RX ADVANCE .   Per test claim: PA required; PA submitted to above mentioned insurance via CoverMyMeds Key/confirmation #/EOC [id number] Status is pending

## 2024-03-02 ENCOUNTER — Other Ambulatory Visit: Payer: Self-pay | Admitting: Cardiology

## 2024-03-29 ENCOUNTER — Other Ambulatory Visit: Payer: Self-pay | Admitting: Cardiology

## 2024-05-12 ENCOUNTER — Other Ambulatory Visit: Payer: Self-pay | Admitting: Cardiology

## 2024-06-06 ENCOUNTER — Other Ambulatory Visit: Payer: Self-pay | Admitting: Cardiology

## 2024-06-20 DIAGNOSIS — D0462 Carcinoma in situ of skin of left upper limb, including shoulder: Secondary | ICD-10-CM | POA: Diagnosis not present

## 2024-06-20 DIAGNOSIS — L570 Actinic keratosis: Secondary | ICD-10-CM | POA: Diagnosis not present

## 2024-06-20 DIAGNOSIS — Z85828 Personal history of other malignant neoplasm of skin: Secondary | ICD-10-CM | POA: Diagnosis not present

## 2024-06-20 DIAGNOSIS — Z872 Personal history of diseases of the skin and subcutaneous tissue: Secondary | ICD-10-CM | POA: Diagnosis not present

## 2024-06-20 DIAGNOSIS — Z859 Personal history of malignant neoplasm, unspecified: Secondary | ICD-10-CM | POA: Diagnosis not present

## 2024-06-20 DIAGNOSIS — L578 Other skin changes due to chronic exposure to nonionizing radiation: Secondary | ICD-10-CM | POA: Diagnosis not present

## 2024-06-20 DIAGNOSIS — D485 Neoplasm of uncertain behavior of skin: Secondary | ICD-10-CM | POA: Diagnosis not present

## 2024-06-25 ENCOUNTER — Telehealth: Payer: Self-pay | Admitting: Internal Medicine

## 2024-06-25 NOTE — Telephone Encounter (Signed)
 Copied from CRM #[id number]. Topic: Medicare AWV >> Jun 25, 2024  9:10 AM Nathanel DEL wrote: Reason for CRM: LM 06/25/24 to change AWV appt from in person to telephone due to [facility] working remote  [employer]; Care Guide Ambulatory Clinical Support [place] [facility] Direct Dial: [phone]

## 2024-06-27 ENCOUNTER — Other Ambulatory Visit: Payer: Self-pay | Admitting: Cardiology

## 2024-07-11 ENCOUNTER — Ambulatory Visit (INDEPENDENT_AMBULATORY_CARE_PROVIDER_SITE_OTHER): Admitting: Emergency Medicine

## 2024-07-11 VITALS — Ht 72.0 in | Wt 235.0 lb

## 2024-07-11 DIAGNOSIS — Z0000 Encounter for general adult medical examination without abnormal findings: Secondary | ICD-10-CM | POA: Diagnosis not present

## 2024-07-11 NOTE — Progress Notes (Signed)
 Subjective:   John Mathews is a 68 y.o. who presents for a Medicare Wellness preventive visit.  As a reminder, Annual Wellness Visits don't include a physical exam, and some assessments may be limited, especially if this visit is performed virtually. We may recommend an in-person follow-up visit with your provider if needed.  Visit Complete: Virtual I connected with  John Mathews on 07/11/24 by a audio enabled telemedicine application and verified that I am speaking with the correct person using two identifiers.  Patient Location: Other:  Work  Arts administrator  I discussed the limitations of evaluation and management by telemedicine. The patient expressed understanding and agreed to proceed.  Vital Signs: Because this visit was a virtual/telehealth visit, some criteria may be missing or patient reported. Any vitals not documented were not able to be obtained and vitals that have been documented are patient reported.  VideoDeclined- This patient declined Librarian, academic. Therefore the visit was completed with audio only.  Persons Participating in Visit: Patient.  AWV Questionnaire: No: Patient Medicare AWV questionnaire was not completed prior to this visit.  Cardiac Risk Factors include: advanced age (>110men, >44 women);male gender;dyslipidemia;hypertension;obesity (BMI >30kg/m2);Other (see comment), Risk factor comments: CAD     Objective:    Today's Vitals   07/11/24 1356  Weight: 235 lb (106.6 kg)  Height: 6' (1.829 m)   Body mass index is 31.87 kg/m.     07/11/2024    2:04 PM 06/21/2023   10:25 AM 08/26/2020   12:34 PM 01/04/2017   10:28 AM 01/04/2017   10:27 AM 06/13/2015   11:09 AM 06/03/2015    8:36 PM  Advanced Directives  Does Patient Have a Medical Advance Directive? Yes No No Yes  Yes  Yes  Yes   Type of Estate agent of [place];Living will   Healthcare Power of [place];Living  will Living will Healthcare Power of [place];Living will  Living will   Does patient want to make changes to medical advance directive? No - Patient declined        Copy of Healthcare Power of Attorney in Chart? No - copy requested   No - copy requested   No - copy requested  No - copy requested   Would patient like information on creating a medical advance directive?  No - Patient declined No - Patient declined    No - patient declined information      Data saved with a previous flowsheet row definition    Current Medications (verified) Outpatient Encounter Medications as of 07/11/2024  Medication Sig   amLODipine  (NORVASC ) 5 MG tablet Take 1 tablet (5 mg total) by mouth daily. Must call and schedule an appointment for further refills 1st attempt   Evolocumab  (REPATHA  SURECLICK) 140 MG/ML SOAJ INJECT 140 MG INTO THE SKIN EVERY 14 (FOURTEEN) DAYS. SCHEDULE MD APPT FOR FURTHER REFILLS   fluorouracil (EFUDEX) 5 % cream Apply topically 2 (two) times daily.   fluticasone -salmeterol (ADVAIR HFA) 230-21 MCG/ACT inhaler Inhale 2 puffs into the lungs 2 (two) times daily.   loratadine (CLARITIN) 10 MG tablet Take 10 mg by mouth daily.   No facility-administered encounter medications on file as of 07/11/2024.    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    Past Surgical History:  Procedure Laterality Date   BASAL CELL CARCINOMA EXCISION     COLONOSCOPY  2012   repeat in 3 years   Family History  Problem Relation Age of Onset   Schizophrenia Mother    Dementia Father    Breast cancer Sister    Social History   Socioeconomic History   Marital status: Married    Spouse name: Clarita   Number of children: 0   Years of education: Not on file   Highest education level: Not on file  Occupational History   Not on file  Tobacco Use    Smoking status: Never   Smokeless tobacco: Never  Vaping Use   Vaping status: Never Used  Substance and Sexual Activity   Alcohol use: Yes    Comment: rarely   Drug use: No   Sexual activity: Yes    Partners: Female  Other Topics Concern   Not on file  Social History Narrative   Land surveying         Social Drivers of Health   Financial Resource Strain: Low Risk  (07/11/2024)   Overall Financial Resource Strain (CARDIA)    Difficulty of Paying Living Expenses: Not hard at all  Food Insecurity: No Food Insecurity (07/11/2024)   Hunger Vital Sign    Worried About Running Out of Food in the Last Year: Never true    Ran Out of Food in the Last Year: Never true  Transportation Needs: No Transportation Needs (07/11/2024)   PRAPARE - Administrator, Civil Service (Medical): No    Lack of Transportation (Non-Medical): No  Physical Activity: Sufficiently Active (07/11/2024)   Exercise Vital Sign    Days of Exercise per Week: 5 days    Minutes of Exercise per Session: 60 min  Stress: No Stress Concern Present (07/11/2024)   [employer] of Occupational Health - Occupational Stress Questionnaire    Feeling of Stress: Only a John  Social Connections: Moderately Isolated (07/11/2024)   Social Connection and Isolation Panel    Frequency of Communication with Friends and Family: More than three times a week    Frequency of Social Gatherings with Friends and Family: More than three times a week    Attends Religious Services: Never    Database administrator or Organizations: No    Attends Engineer, structural: Never    Marital Status: Married    Tobacco Counseling Counseling given: Not Answered    Clinical Intake:  Pre-visit preparation completed: Yes  Pain : No/denies pain     BMI - recorded: 31.87 Nutritional Status: BMI > 30  Obese Nutritional Risks: None Diabetes: No  No results found for: HGBA1C   How often do you need to have someone  help you when you read instructions, pamphlets, or other written materials from your doctor or pharmacy?: 1 - Never  Interpreter Needed?: No  Information entered by :: Vina Ned, CMA   Activities of Daily Living     07/11/2024    1:57 PM  In your present state of health, do you have any difficulty performing the following activities:  Hearing? 0  Vision? 0  Difficulty concentrating or making decisions? 0  Walking or climbing stairs? 0  Dressing or bathing? 0  Doing errands, shopping? 0  Preparing Food and eating ? N  Using the Toilet? N  In the past six months, have you accidently leaked urine? N  Do you have problems with loss of bowel control? N  Managing your Medications? N  Managing your Finances? N  Housekeeping or managing your Housekeeping? N    Patient Care Team: Justus Leita DEL, MD as PCP - General (Internal Medicine) Myrna Adine Anes, MD as Consulting Physician (Ophthalmology) Cathlyn Seal, MD as Referring Physician (Dermatology)  I have updated your Care Teams any recent Medical Services you may have received from other providers in the past year.     Assessment:   This is a routine wellness examination for John Mathews.  Hearing/Vision screen Hearing Screening - Comments:: Denies hearing loss  Vision Screening - Comments:: Gets routine eye exams, Dr. Adine Myrna, [address] [place]   Goals Addressed             This Visit's Progress    Patient Stated       Lose 10 lbs       Depression Screen     07/11/2024    2:02 PM 02/01/2024    3:51 PM 06/21/2023   10:23 AM 01/31/2023    8:25 AM 01/26/2022    8:38 AM 05/05/2021   11:02 AM 01/21/2021    8:41 AM  PHQ 2/9 Scores  PHQ - 2 Score 0 0 0 0 0 0 0  PHQ- 9 Score 2 2 0 3 0 0 0    Fall Risk     07/11/2024    2:05 PM 02/01/2024    3:51 PM 06/21/2023   10:25 AM 01/31/2023    8:25 AM 01/26/2022    8:38 AM  Fall Risk   Falls in the past year? 0 0 0 1 1  Number falls in past yr: 0 0 0 1 1  Injury with  Fall? 0 0 0 0 0  Risk for fall due to : No Fall Risks No Fall Risks No Fall Risks No Fall Risks History of fall(s)  Follow up Falls evaluation completed Falls evaluation completed Falls prevention discussed;Falls evaluation completed Falls evaluation completed Falls evaluation completed      Data saved with a previous flowsheet row definition    MEDICARE RISK AT HOME:  Medicare Risk at Home Any stairs in or around the home?: Yes If so, are there any without handrails?: No Home free of loose throw rugs in walkways, pet beds, electrical cords, etc?: Yes Adequate lighting in your home to reduce risk of falls?: Yes Life alert?: No Use of a cane, walker or w/c?: No Grab bars in the bathroom?: No Shower chair or bench in shower?: No Elevated toilet seat or a handicapped toilet?: Yes  TIMED UP AND GO:  Was the test performed?  No  Cognitive Function: 6CIT completed        07/11/2024    2:07 PM 06/21/2023   10:29 AM 01/31/2023    8:32 AM  6CIT Screen  What Year? 0 points 0 points 0 points  What month? 0 points 0 points 0 points  What time? 0 points 0 points 0 points  Count back from 20 0 points 0 points 0 points  Months in reverse 0 points 2 points 0 points  Repeat phrase 2 points 0 points 2 points  Total Score 2 points 2 points 2 points    Immunizations Immunization History  Administered Date(s) Administered   Fluad Quad(high Dose 65+) 08/13/2022   Influenza,inj,Quad PF,6+ Mos 07/24/2018, 08/17/2019, 09/12/2021   Influenza-Unspecified 07/19/2015, 08/27/2017, 08/01/2020   Moderna SARS-COV2 Booster Vaccination 09/29/2020   PNEUMOCOCCAL CONJUGATE-20 01/31/2023   Pfizer(Comirnaty)Fall Seasonal Vaccine 12 years and older 08/13/2022   Pneumococcal  Polysaccharide-23 01/12/2011   Tdap 04/20/2011, 01/26/2022   Zoster Recombinant(Shingrix ) 01/19/2019, 04/23/2019   Zoster, Live 01/24/2014    Screening Tests Health Maintenance  Topic Date Due   COVID-19 Vaccine (2 - Pfizer risk  series) 09/03/2022   Influenza Vaccine  01/29/2025 (Originally 06/01/2024)   Medicare Annual Wellness (AWV)  07/11/2025   Colonoscopy  09/08/2025   DTaP/Tdap/Td (3 - Td or Tdap) 01/27/2032   Pneumococcal Vaccine: 50+ Years  Completed   Hepatitis C Screening  Completed   Zoster Vaccines- Shingrix   Completed   HPV VACCINES  Aged Out   Meningococcal B Vaccine  Aged Out    Health Maintenance Items Addressed: See Nurse Notes at the end of this note  Additional Screening:  Vision Screening: Recommended annual ophthalmology exams for early detection of glaucoma and other disorders of the eye. Is the patient up to date with their annual eye exam?  Yes  Who is the provider or what is the name of the office in which the patient attends annual eye exams? Dr. Adine Novak @ [facility], [place]  Dental Screening: Recommended annual dental exams for proper oral hygiene  Community Resource Referral / Chronic Care Management: CRR required this visit?  No   CCM required this visit?  No   Plan:    I have personally reviewed and noted the following in the patient's chart:   Medical and social history Use of alcohol, tobacco or illicit drugs  Current medications and supplements including opioid prescriptions. Patient is not currently taking opioid prescriptions. Functional ability and status Nutritional status Physical activity Advanced directives List of other physicians Hospitalizations, surgeries, and ER visits in previous 12 months Vitals Screenings to include cognitive, depression, and falls Referrals and appointments  In addition, I have reviewed and discussed with patient certain preventive protocols, quality metrics, and best practice recommendations. A written personalized care plan for preventive services as well as general preventive health recommendations were provided to patient.   Vina Ned, CMA   07/11/2024   After Visit Summary: (Mail) Due to this being a  telephonic visit, the after visit summary with patients personalized plan was offered to patient via mail   Notes:  6 CIT Score - 2 Declined flu and covid vaccines

## 2024-07-11 NOTE — Patient Instructions (Signed)
 Mr. John Mathews,  Thank you for taking the time for your Medicare Wellness Visit. I appreciate your continued commitment to your health goals. Please review the care plan we discussed, and feel free to reach out if I can assist you further.  Medicare recommends these wellness visits once per year to help you and your care team stay ahead of potential health issues. These visits are designed to focus on prevention, allowing your provider to concentrate on managing your acute and chronic conditions during your regular appointments.  Please note that Annual Wellness Visits do not include a physical exam. Some assessments may be limited, especially if the visit was conducted virtually. If needed, we may recommend a separate in-person follow-up with your provider.  Ongoing Care Seeing your primary care provider every 3 to 6 months helps us  monitor your health and provide consistent, personalized care.   Referrals If a referral was made during today's visit and you haven't received any updates within two weeks, please contact the referred provider directly to check on the status.  Recommended Screenings:  Health Maintenance  Topic Date Due   COVID-19 Vaccine (2 - Pfizer risk series) 09/03/2022   Flu Shot  01/29/2025*   Medicare Annual Wellness Visit  07/11/2025   Colon Cancer Screening  09/08/2025   DTaP/Tdap/Td vaccine (3 - Td or Tdap) 01/27/2032   Pneumococcal Vaccine for age over 69  Completed   Hepatitis C Screening  Completed   Zoster (Shingles) Vaccine  Completed   HPV Vaccine  Aged Out   Meningitis B Vaccine  Aged Out  *Topic was postponed. The date shown is not the original due date.       07/11/2024    2:04 PM  Advanced Directives  Does Patient Have a Medical Advance Directive? Yes  Type of Estate agent of [place];Living will  Does patient want to make changes to medical advance directive? No - Patient declined  Copy of Healthcare Power of Attorney in  Chart? No - copy requested   Advance Care Planning is important because it: Ensures you receive medical care that aligns with your values, goals, and preferences. Provides guidance to your family and loved ones, reducing the emotional burden of decision-making during critical moments.  Vision: Annual vision screenings are recommended for early detection of glaucoma, cataracts, and diabetic retinopathy. These exams can also reveal signs of chronic conditions such as diabetes and high blood pressure.  Dental: Annual dental screenings help detect early signs of oral cancer, gum disease, and other conditions linked to overall health, including heart disease and diabetes.  Please see the attached documents for additional preventive care recommendations.   Fall Prevention in the Home, Adult Falls can cause injuries and affect people of all ages. There are many simple things that you can do to make your home safe and to help prevent falls. If you need it, ask for help making these changes. What actions can I take to prevent falls? General information Use good lighting in all rooms. Make sure to: Replace any light bulbs that burn out. Turn on lights if it is dark and use night-lights. Keep items that you use often in easy-to-reach places. Lower the shelves around your home if needed. Move furniture so that there are clear paths around it. Do not keep throw rugs or other things on the floor that can make you trip. If any of your floors are uneven, fix them. Add color or contrast paint or tape to clearly mark and help  you see: Grab bars or handrails. First and last steps of staircases. Where the edge of each step is. If you use a ladder or stepladder: Make sure that it is fully opened. Do not climb a closed ladder. Make sure the sides of the ladder are locked in place. Have someone hold the ladder while you use it. Know where your pets are as you move through your home. What can I do in the  bathroom?     Keep the floor dry. Clean up any water that is on the floor right away. Remove soap buildup in the bathtub or shower. Buildup makes bathtubs and showers slippery. Use non-skid mats or decals on the floor of the bathtub or shower. Attach bath mats securely with double-sided, non-slip rug tape. If you need to sit down while you are in the shower, use a non-slip stool. Install grab bars by the toilet and in the bathtub and shower. Do not use towel bars as grab bars. What can I do in the bedroom? Make sure that you have a light by your bed that is easy to reach. Do not use any sheets or blankets on your bed that hang to the floor. Have a firm bench or chair with side arms that you can use for support when you get dressed. What can I do in the kitchen? Clean up any spills right away. If you need to reach something above you, use a sturdy step stool that has a grab bar. Keep electrical cables out of the way. Do not use floor polish or wax that makes floors slippery. What can I do with my stairs? Do not leave anything on the stairs. Make sure that you have a light switch at the top and the bottom of the stairs. Have them installed if you do not have them. Make sure that there are handrails on both sides of the stairs. Fix handrails that are broken or loose. Make sure that handrails are as long as the staircases. Install non-slip stair treads on all stairs in your home if they do not have carpet. Avoid having throw rugs at the top or bottom of stairs, or secure the rugs with carpet tape to prevent them from moving. Choose a carpet design that does not hide the edge of steps on the stairs. Make sure that carpet is firmly attached to the stairs. Fix any carpet that is loose or worn. What can I do on the outside of my home? Use bright outdoor lighting. Repair the edges of walkways and driveways and fix any cracks. Clear paths of anything that can make you trip, such as tools or  rocks. Add color or contrast paint or tape to clearly mark and help you see high doorway thresholds. Trim any bushes or trees on the main path into your home. Check that handrails are securely fastened and in good repair. Both sides of all steps should have handrails. Install guardrails along the edges of any raised decks or porches. Have leaves, snow, and ice cleared regularly. Use sand, salt, or ice melt on walkways during winter months if you live where there is ice and snow. In the garage, clean up any spills right away, including grease or oil spills. What other actions can I take? Review your medicines with your health care provider. Some medicines can make you confused or feel dizzy. This can increase your chance of falling. Wear closed-toe shoes that fit well and support your feet. Wear shoes that have rubber soles  and low heels. Use a cane, walker, scooter, or crutches that help you move around if needed. Talk with your provider about other ways that you can decrease your risk of falls. This may include seeing a physical therapist to learn to do exercises to improve movement and strength. Where to find more information Centers for Disease Control and Prevention, [employer]: TonerPromos.no [employer] on Aging: BaseRingTones.pl National Institute on Aging: BaseRingTones.pl Contact a health care provider if: You are afraid of falling at home. You feel weak, drowsy, or dizzy at home. You fall at home. Get help right away if you: Lose consciousness or have trouble moving after a fall. Have a fall that causes a head injury. These symptoms may be an emergency. Get help right away. Call 911. Do not wait to see if the symptoms will go away. Do not drive yourself to the hospital. This information is not intended to replace advice given to you by your health care provider. Make sure you discuss any questions you have with your health care provider. Document Revised: 06/21/2022 Document Reviewed:  06/21/2022 Elsevier Patient Education  2024 [employer].

## 2024-07-12 DIAGNOSIS — D0462 Carcinoma in situ of skin of left upper limb, including shoulder: Secondary | ICD-10-CM | POA: Diagnosis not present

## 2024-07-12 DIAGNOSIS — C44629 Squamous cell carcinoma of skin of left upper limb, including shoulder: Secondary | ICD-10-CM | POA: Diagnosis not present

## 2024-07-19 ENCOUNTER — Encounter: Payer: Self-pay | Admitting: Internal Medicine

## 2024-07-19 ENCOUNTER — Ambulatory Visit (INDEPENDENT_AMBULATORY_CARE_PROVIDER_SITE_OTHER): Admitting: Internal Medicine

## 2024-07-19 VITALS — BP 132/78 | HR 75 | Temp 98.3°F | Resp 18 | Ht 72.0 in | Wt 230.0 lb

## 2024-07-19 DIAGNOSIS — I2510 Atherosclerotic heart disease of native coronary artery without angina pectoris: Secondary | ICD-10-CM | POA: Diagnosis not present

## 2024-07-19 DIAGNOSIS — I10 Essential (primary) hypertension: Secondary | ICD-10-CM | POA: Diagnosis not present

## 2024-07-19 DIAGNOSIS — J4520 Mild intermittent asthma, uncomplicated: Secondary | ICD-10-CM | POA: Diagnosis not present

## 2024-07-19 DIAGNOSIS — L03114 Cellulitis of left upper limb: Secondary | ICD-10-CM | POA: Diagnosis not present

## 2024-07-19 DIAGNOSIS — G720 Drug-induced myopathy: Secondary | ICD-10-CM | POA: Insufficient documentation

## 2024-07-19 MED ORDER — CEPHALEXIN 500 MG PO CAPS: 500.0000 mg | ORAL_CAPSULE | Freq: Four times a day (QID) | ORAL | 0 refills | Status: AC

## 2024-07-19 MED ORDER — AMLODIPINE BESYLATE 5 MG PO TABS: 5.0000 mg | ORAL_TABLET | Freq: Every day | ORAL | 1 refills | Status: AC

## 2024-07-19 MED ORDER — FLUTICASONE-SALMETEROL 230-21 MCG/ACT IN AERO: 2.0000 | INHALATION_SPRAY | Freq: Two times a day (BID) | RESPIRATORY_TRACT | 3 refills | Status: AC

## 2024-07-19 NOTE — Assessment & Plan Note (Signed)
 Doing well without chest pain or shortness of breath. Taking Repatha  q 2 weeks Did not tolerate statins Cardiology released him to follow up PRN

## 2024-07-19 NOTE — Assessment & Plan Note (Signed)
 Doing well on daily Advair. He declines Flu and Covid vaccines

## 2024-07-19 NOTE — Progress Notes (Signed)
 Date:  07/19/2024   Name:  John Mathews   DOB:  18-Jul-1956   MRN:  [id number]   Chief Complaint: Hypertension and Coronary Artery Disease  Hypertension This is a chronic problem. The problem is controlled. Pertinent negatives include no shortness of breath. Past treatments include calcium  channel blockers. The current treatment provides significant improvement. Hypertensive end-organ damage includes kidney disease. There is no history of CAD/MI or CVA.  Coronary Artery Disease Presents for follow-up visit. Pertinent negatives include no chest pressure, chest tightness, dizziness or shortness of breath. Risk factors include hypertension. The symptoms have been stable. Compliance with diet is good. Compliance with exercise is good. Compliance with medications is good.  Asthma There is no cough, shortness of breath or wheezing. This is a recurrent problem. The problem occurs rarely. Pertinent negatives include no fever. He reports significant improvement on treatment. His symptoms are not alleviated by steroid inhaler and beta-agonist. His past medical history is significant for asthma.    Review of Systems  Constitutional:  Negative for chills, fatigue and fever.  Respiratory:  Negative for cough, chest tightness, shortness of breath and wheezing.   Gastrointestinal:  Negative for abdominal pain.  Musculoskeletal:  Negative for arthralgias.  Skin:  Positive for wound (left thumb SCCA excision site).  Neurological:  Negative for dizziness and light-headedness.  Psychiatric/Behavioral:  Negative for dysphoric mood and sleep disturbance. The patient is not nervous/anxious.      Lab Results  Component Value Date   NA 141 02/01/2024   K 4.3 02/01/2024   CO2 24 02/01/2024   GLUCOSE 89 02/01/2024   BUN 14 02/01/2024   CREATININE 1.03 02/01/2024   CALCIUM  9.4 02/01/2024   EGFR 80 02/01/2024   GFRNONAA >60 08/26/2020   Lab Results  Component Value Date   CHOL 123 02/01/2024    HDL 57 02/01/2024   LDLCALC 51 02/01/2024   TRIG 77 02/01/2024   CHOLHDL 2.2 02/01/2024   Lab Results  Component Value Date   TSH 0.762 08/18/2015   No results found for: HGBA1C Lab Results  Component Value Date   WBC 6.0 02/01/2024   HGB 15.1 02/01/2024   HCT 44.2 02/01/2024   MCV 100 (H) 02/01/2024   PLT 194 02/01/2024   Lab Results  Component Value Date   ALT 20 02/01/2024   AST 21 02/01/2024   ALKPHOS 75 02/01/2024   BILITOT 0.5 02/01/2024   No results found for: MARIEN BOLLS, VD25OH   Patient Active Problem List   Diagnosis Date Noted   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   Essential hypertension 02/01/2024   CAD (coronary artery disease), native coronary artery 04/01/2023   DOE (dyspnea on exertion) 03/15/2023   Hx of squamous cell carcinoma of skin 01/21/2021   Plantar fasciitis of right foot 01/18/2018   Gastroesophageal reflux disease 01/04/2017   Mild mitral regurgitation 10/20/2016   Mixed hyperlipidemia 08/19/2016   Mild intermittent asthma without complication 08/19/2016   Tubular adenoma of colon 05/21/2015   Dermatitis of multiple sites 05/21/2015    Allergies  Allergen Reactions   Atorvastatin  Other (See Comments)    Severe joint and muscle pain; brain fog   Diphenhydramine Hcl Other (See Comments)    hyperactivity    Past Surgical History:  Procedure Laterality Date   BASAL CELL CARCINOMA EXCISION     COLONOSCOPY  2012   repeat in 3 years    Social History   Tobacco Use   Smoking  status: Never    Passive exposure: Never   Smokeless tobacco: Never  Vaping Use   Vaping status: Never Used  Substance Use Topics   Alcohol use: Yes    Comment: rarely   Drug use: No     Medication list has been reviewed and updated.  Current Meds  Medication Sig   cephALEXin  (KEFLEX ) 500 MG capsule Take 1 capsule (500 mg total) by mouth 4 (four) times daily for 10 days.   Evolocumab  (REPATHA  SURECLICK)  140 MG/ML SOAJ INJECT 140 MG INTO THE SKIN EVERY 14 (FOURTEEN) DAYS. SCHEDULE MD APPT FOR FURTHER REFILLS   loratadine (CLARITIN) 10 MG tablet Take 10 mg by mouth daily.   [DISCONTINUED] amLODipine  (NORVASC ) 5 MG tablet Take 1 tablet (5 mg total) by mouth daily. Must call and schedule an appointment for further refills 1st attempt   [DISCONTINUED] fluticasone -salmeterol (ADVAIR HFA) 230-21 MCG/ACT inhaler Inhale 2 puffs into the lungs 2 (two) times daily.       02/01/2024    3:51 PM 01/31/2023    8:25 AM 01/26/2022    8:38 AM 05/05/2021   11:02 AM  GAD 7 : Generalized Anxiety Score  Nervous, Anxious, on Edge 0 0 0 0  Control/stop worrying 0 0 0 0  Worry too much - different things 0 1 0 0  Trouble relaxing 0 0 0 0  Restless 1 0 0 0  Easily annoyed or irritable 0 1 0 0  Afraid - awful might happen 0 0 0 0  Total GAD 7 Score 1 2 0 0  Anxiety Difficulty Not difficult at all Not difficult at all Not difficult at all Not difficult at all       07/11/2024    2:02 PM 02/01/2024    3:51 PM 06/21/2023   10:23 AM  Depression screen PHQ 2/9  Decreased Interest 0 0 0  Down, Depressed, Hopeless 0 0 0  PHQ - 2 Score 0 0 0  Altered sleeping 0 2 0  Tired, decreased energy 2 0 0  Change in appetite 0 0 0  Feeling bad or failure about yourself  0 0 0  Trouble concentrating 0 0 0  Moving slowly or fidgety/restless 0 0 0  Suicidal thoughts 0 0 0  PHQ-9 Score 2 2 0  Difficult doing work/chores  Not difficult at all Not difficult at all    BP Readings from Last 3 Encounters:  07/19/24 132/78  02/01/24 128/76  06/21/23 130/80    Physical Exam Vitals and nursing note reviewed.  Constitutional:      General: He is not in acute distress.    Appearance: Normal appearance. He is well-developed.  HENT:     Head: Normocephalic and atraumatic.  Neck:     Vascular: No carotid bruit.  Cardiovascular:     Rate and Rhythm: Normal rate and regular rhythm.  Pulmonary:     Effort: Pulmonary effort is  normal. No respiratory distress.     Breath sounds: No wheezing or rhonchi.  Musculoskeletal:     Cervical back: Normal range of motion.     Right lower leg: No edema.     Left lower leg: No edema.  Lymphadenopathy:     Cervical: No cervical adenopathy.  Skin:    General: Skin is warm and dry.     Capillary Refill: Capillary refill takes less than 2 seconds.     Findings: Wound present. No rash.     Comments: Sutures intact left thumb -  some mild serous drainage but 1 cm surrounding erythema and mild swelling  Neurological:     Mental Status: He is alert and oriented to person, place, and time.  Psychiatric:        Mood and Affect: Mood normal.        Behavior: Behavior normal.     Wt Readings from Last 3 Encounters:  07/19/24 230 lb (104.3 kg)  07/11/24 235 lb (106.6 kg)  02/01/24 228 lb 8 oz (103.6 kg)    BP 132/78   Pulse 75   Temp 98.3 F (36.8 C) (Oral)   Resp 18   Ht 6' (1.829 m)   Wt 230 lb (104.3 kg)   SpO2 97%   BMI 31.19 kg/m   Assessment and Plan:  Problem List Items Addressed This Visit       Unprioritized   CAD (coronary artery disease), native coronary artery (Chronic)   Doing well without chest pain or shortness of breath. Taking Repatha  q 2 weeks Did not tolerate statins Cardiology released him to follow up PRN      Relevant Medications   amLODipine  (NORVASC ) 5 MG tablet   Essential hypertension - Primary (Chronic)   Blood pressure is well controlled on amlodipine . No medication side effects noted. Plan to continue current medications.       Relevant Medications   amLODipine  (NORVASC ) 5 MG tablet   Mild intermittent asthma without complication (Chronic)   Doing well on daily Advair. He declines Flu and Covid vaccines      Relevant Medications   fluticasone -salmeterol (ADVAIR HFA) 230-21 MCG/ACT inhaler   Statin myopathy   Other Visit Diagnoses       Cellulitis of left upper extremity       s/p skin cancer excision - worrisome  for mild infection Keflex  qid Elevate - see Dermatology sooner if worsening   Relevant Medications   cephALEXin  (KEFLEX ) 500 MG capsule       Return in about 6 months (around 01/16/2025) for [facility] Dr. Sol.    Leita HILARIO Adie, MD [facility] [place]

## 2024-07-19 NOTE — Assessment & Plan Note (Signed)
 Blood pressure is well controlled on amlodipine . No medication side effects noted. Plan to continue current medications.

## 2024-11-30 ENCOUNTER — Telehealth: Payer: Self-pay

## 2024-11-30 NOTE — Telephone Encounter (Signed)
 Copied from CRM [id number]. Topic: Appointments - Transfer of Care >> Nov 30, 2024  2:23 PM Antwanette L wrote: Pt is requesting to transfer FROM: Dr. Justus Pt is requesting to transfer TO: Dr. Sol Reason for requested transfer: Dr. Justus is no longer at the practice It is the responsibility of the team the patient would like to transfer to (Dr. Sol) to reach out to the patient if for any reason this transfer is not acceptable.

## 2025-01-14 ENCOUNTER — Encounter: Admitting: Family Medicine

## 2025-07-17 ENCOUNTER — Ambulatory Visit
